# Patient Record
Sex: Male | Born: 1950 | Race: White | Hispanic: No | Marital: Married | Smoking: Never smoker
Health system: Southern US, Community
[De-identification: ages and names within clinical notes are randomized; demographics above are authoritative.]

## PROBLEM LIST (undated history)

## (undated) DIAGNOSIS — I2510 Atherosclerotic heart disease of native coronary artery without angina pectoris: Secondary | ICD-10-CM

## (undated) DIAGNOSIS — C801 Malignant (primary) neoplasm, unspecified: Secondary | ICD-10-CM

## (undated) DIAGNOSIS — G4730 Sleep apnea, unspecified: Secondary | ICD-10-CM

## (undated) DIAGNOSIS — I209 Angina pectoris, unspecified: Secondary | ICD-10-CM

## (undated) HISTORY — PX: VASECTOMY: SHX75

## (undated) HISTORY — PX: TONSILLECTOMY: SUR1361

## (undated) HISTORY — PX: HERNIA REPAIR: SHX51

---

## 2012-05-04 ENCOUNTER — Emergency Department (HOSPITAL_COMMUNITY)
Admission: EM | Admit: 2012-05-04 | Discharge: 2012-05-04 | Disposition: A | Discharge status: Home / Self Care | Payer: 59 | Attending: Emergency Medicine | Admitting: Emergency Medicine

## 2012-05-04 ENCOUNTER — Emergency Department (HOSPITAL_COMMUNITY): Payer: 59

## 2012-05-04 ENCOUNTER — Encounter (HOSPITAL_COMMUNITY): Payer: Self-pay | Admitting: *Deleted

## 2012-05-04 DIAGNOSIS — Z7982 Long term (current) use of aspirin: Secondary | ICD-10-CM | POA: Insufficient documentation

## 2012-05-04 DIAGNOSIS — R55 Syncope and collapse: Secondary | ICD-10-CM | POA: Insufficient documentation

## 2012-05-04 LAB — COMPREHENSIVE METABOLIC PANEL
ALT: 19 U/L (ref 0–53)
Alkaline Phosphatase: 106 U/L (ref 39–117)
BUN: 14 mg/dL (ref 6–23)
CO2: 28 mEq/L (ref 19–32)
Calcium: 9.4 mg/dL (ref 8.4–10.5)
GFR calc Af Amer: 88 mL/min — ABNORMAL LOW (ref >90)
GFR calc non Af Amer: 76 mL/min — ABNORMAL LOW (ref >90)
Glucose, Bld: 83 mg/dL (ref 70–99)
Potassium: 4.1 mEq/L (ref 3.5–5.1)
Sodium: 135 mEq/L (ref 135–145)
Total Protein: 7.5 g/dL (ref 6.0–8.3)

## 2012-05-04 LAB — CBC
HCT: 47.1 % (ref 39.0–52.0)
Hemoglobin: 16.4 g/dL (ref 13.0–17.0)
MCH: 31.5 pg (ref 26.0–34.0)
MCV: 90.6 fL (ref 78.0–100.0)
RBC: 5.2 MIL/uL (ref 4.22–5.81)
WBC: 6.2 10*3/uL (ref 4.0–10.5)

## 2012-05-04 LAB — DIFFERENTIAL
Eosinophils Absolute: 0.1 10*3/uL (ref 0.0–0.7)
Eosinophils Relative: 2 % (ref 0–5)
Lymphocytes Relative: 29 % (ref 12–46)
Lymphs Abs: 1.8 10*3/uL (ref 0.7–4.0)
Monocytes Relative: 6 % (ref 3–12)

## 2012-05-04 LAB — BASIC METABOLIC PANEL
BUN: 11 mg/dL (ref 6–23)
CO2: 29 mEq/L (ref 19–32)
Calcium: 9 mg/dL (ref 8.4–10.5)
Chloride: 99 mEq/L (ref 96–112)
Creatinine, Ser: 1.03 mg/dL (ref 0.50–1.35)
Glucose, Bld: 77 mg/dL (ref 70–99)

## 2012-05-04 LAB — URINALYSIS, ROUTINE W REFLEX MICROSCOPIC
Bilirubin Urine: NEGATIVE
Glucose, UA: NEGATIVE mg/dL
Ketones, ur: 15 mg/dL — AB
Protein, ur: NEGATIVE mg/dL
pH: 6.5 (ref 5.0–8.0)

## 2012-05-04 LAB — TROPONIN I: Troponin I: <0.3 ng/mL (ref <0.30)

## 2012-05-04 NOTE — ED Notes (Signed)
Pt reports at approx 1130 having onset of feeling faint and lightheaded. At that time, had bilateral numbness to arms and legs and top of head. Reports symptoms have resolved. Grips are equal, no facial droop, no arm drift, speech is clear.

## 2012-05-04 NOTE — ED Notes (Signed)
Patients cbg 128 informed rn ed white.

## 2012-05-04 NOTE — ED Provider Notes (Signed)
History     CSN: [id number]  Arrival date & time 05/04/12  1232   First MD Initiated Contact with Patient 05/04/12 1646      Chief Complaint  Patient presents with  . Numbness  . Near Syncope    (Consider location/radiation/quality/duration/timing/severity/associated sxs/prior treatment) HPI Comments: Patient is a 61 year old male with no significant past medical history who presents with multiple episodes of lightheadedness and numbness/tingling that occurred today. Patient reports gradual onset of the symptoms with each episode which lasted for about 1 minute at a time. Patient reports feeling lightheaded with each episode but the associated numbness changed from arms to lips to legs. Episodes would resolve spontaneously without intervention. No associated symptoms. No aggravating/alleviating factors.    History reviewed. No pertinent past medical history.  History reviewed. No pertinent past surgical history.  History reviewed. No pertinent family history.  History  Substance Use Topics  . Smoking status: Not on file  . Smokeless tobacco: Not on file  . Alcohol Use: No      Review of Systems  Neurological: Positive for weakness and light-headedness.  All other systems reviewed and are negative.    Allergies  Review of patient's allergies indicates no known allergies.  Home Medications   Current Outpatient Rx  Name  Route  Sig  Dispense  Refill  . ASPIRIN EC 81 MG PO TBEC   Oral   Take 81 mg by mouth daily.           BP 157/89  Pulse 70  Temp 98.1 F (36.7 C) (Oral)  Resp 18  SpO2 95%  Physical Exam  Nursing note and vitals reviewed. Constitutional: He is oriented to person, place, and time. He appears well-developed and well-nourished. No distress.  HENT:  Head: Normocephalic and atraumatic.  Mouth/Throat: Oropharynx is clear and moist. No oropharyngeal exudate.  Eyes: Conjunctivae normal and EOM are normal. Pupils are equal, round, and reactive  to light. No scleral icterus.  Neck: Normal range of motion. Neck supple.  Cardiovascular: Normal rate, regular rhythm and intact distal pulses.  Exam reveals no gallop and no friction rub.   No murmur heard. Pulmonary/Chest: Effort normal and breath sounds normal. He has no wheezes. He has no rales. He exhibits no tenderness.  Abdominal: Soft. He exhibits no distension. There is no tenderness. There is no rebound and no guarding.  Musculoskeletal: Normal range of motion.  Neurological: He is alert and oriented to person, place, and time. No cranial nerve deficit. Coordination normal.       Strength and sensation equal and intact bilaterally. Cerebellar testing done without difficulty. Speech is goal-oriented. Moves limbs without ataxia.   Skin: Skin is warm and dry. He is not diaphoretic.  Psychiatric: He has a normal mood and affect. His behavior is normal.    ED Course  Procedures (including critical care time)  Labs Reviewed  COMPREHENSIVE METABOLIC PANEL - Abnormal; Notable for the following:    GFR calc non Af Amer 76 (*)     GFR calc Af Amer 88 (*)     All other components within normal limits  GLUCOSE, CAPILLARY - Abnormal; Notable for the following:    Glucose-Capillary 128 (*)     All other components within normal limits  URINALYSIS, ROUTINE W REFLEX MICROSCOPIC - Abnormal; Notable for the following:    Ketones, ur 15 (*)     All other components within normal limits  BASIC METABOLIC PANEL - Abnormal; Notable for the following:  GFR calc non Af Amer 76 (*)     GFR calc Af Amer 89 (*)     All other components within normal limits  PROTIME-INR  APTT  CBC  DIFFERENTIAL  TROPONIN I  GLUCOSE, CAPILLARY  POCT I-STAT TROPONIN I   Ct Head Wo Contrast  05/04/2012  *RADIOLOGY REPORT*  Clinical Data: Faint sensation; lightheadedness.  Bilateral numbness involving the arms and legs.  CT HEAD WITHOUT CONTRAST  Technique:  Contiguous axial images were obtained from the base of  the skull through the vertex without contrast.  Comparison: None.  Findings: There is no evidence of acute infarction, mass lesion, or intra- or extra-axial hemorrhage on CT.  Focally increased density of brain structures at the level of the foramen magnum is thought to reflect beam hardening artifact.  Minimal periventricular white matter change may reflect small vessel ischemic microangiopathy.  The posterior fossa, including the cerebellum, brainstem and fourth ventricle, is within normal limits.  The third and lateral ventricles, and basal ganglia are unremarkable in appearance.  The cerebral hemispheres are symmetric in appearance, with normal gray- white differentiation.  No mass effect or midline shift is seen.  There is no evidence of fracture; visualized osseous structures are unremarkable in appearance.  The visualized portions of the orbits are within normal limits.  The paranasal sinuses and mastoid air cells are well-aerated.  No significant soft tissue abnormalities are seen.  IMPRESSION:  1.  No acute intracranial pathology seen on CT. 2.  Minimal small vessel ischemic microangiopathy.   Original Report Authenticated By: Tonia Ghent, M.D.      1. Pre-syncope       MDM  11:19 PM Labs unremarkable. Head CT unremarkable. Patient's symptoms due not warrant any further workup at this time. I will recommend a neurology follow up for the patient. Patient should return with worsening or concerning symptoms.         Emilia Beck, [place] 05/05/12 0116

## 2012-05-04 NOTE — ED Notes (Signed)
Pt states last episode of dizziness with generalized numbness and tingling was at approximately 1500.  Pt states that it seems to come in waves.  Pt denies symptoms at this time.

## 2012-05-23 NOTE — ED Provider Notes (Signed)
Medical screening examination/treatment/procedure(s) were performed by non-physician practitioner and as supervising physician I was immediately available for consultation/collaboration.   Bodey Frizell, MD 05/23/12 0828 

## 2013-12-15 ENCOUNTER — Encounter (HOSPITAL_COMMUNITY): Payer: Self-pay | Admitting: Pharmacy Technician

## 2013-12-16 ENCOUNTER — Other Ambulatory Visit: Payer: Self-pay | Admitting: *Deleted

## 2013-12-16 ENCOUNTER — Inpatient Hospital Stay (HOSPITAL_COMMUNITY)
Admission: RE | Admit: 2013-12-16 | Discharge: 2013-12-25 | DRG: 234 | Disposition: A | Discharge status: Home / Self Care | Payer: 59 | Source: Ambulatory Visit | Attending: Cardiothoracic Surgery | Admitting: Cardiothoracic Surgery

## 2013-12-16 ENCOUNTER — Encounter (HOSPITAL_COMMUNITY)
Admission: RE | Disposition: A | Discharge status: Home / Self Care | Payer: Self-pay | Source: Ambulatory Visit | Attending: Cardiothoracic Surgery

## 2013-12-16 ENCOUNTER — Encounter (HOSPITAL_COMMUNITY): Payer: Self-pay | Admitting: General Practice

## 2013-12-16 DIAGNOSIS — R6510 Systemic inflammatory response syndrome (SIRS) of non-infectious origin without acute organ dysfunction: Secondary | ICD-10-CM | POA: Diagnosis present

## 2013-12-16 DIAGNOSIS — I2584 Coronary atherosclerosis due to calcified coronary lesion: Principal | ICD-10-CM

## 2013-12-16 DIAGNOSIS — I2510 Atherosclerotic heart disease of native coronary artery without angina pectoris: Secondary | ICD-10-CM

## 2013-12-16 DIAGNOSIS — D72829 Elevated white blood cell count, unspecified: Secondary | ICD-10-CM | POA: Diagnosis not present

## 2013-12-16 DIAGNOSIS — Z8249 Family history of ischemic heart disease and other diseases of the circulatory system: Secondary | ICD-10-CM

## 2013-12-16 DIAGNOSIS — I4891 Unspecified atrial fibrillation: Secondary | ICD-10-CM | POA: Diagnosis not present

## 2013-12-16 DIAGNOSIS — J9819 Other pulmonary collapse: Secondary | ICD-10-CM | POA: Diagnosis present

## 2013-12-16 DIAGNOSIS — I200 Unstable angina: Secondary | ICD-10-CM | POA: Diagnosis present

## 2013-12-16 DIAGNOSIS — Z951 Presence of aortocoronary bypass graft: Secondary | ICD-10-CM

## 2013-12-16 DIAGNOSIS — E785 Hyperlipidemia, unspecified: Secondary | ICD-10-CM | POA: Diagnosis present

## 2013-12-16 DIAGNOSIS — D62 Acute posthemorrhagic anemia: Secondary | ICD-10-CM | POA: Diagnosis not present

## 2013-12-16 DIAGNOSIS — C61 Malignant neoplasm of prostate: Secondary | ICD-10-CM | POA: Diagnosis present

## 2013-12-16 DIAGNOSIS — I10 Essential (primary) hypertension: Secondary | ICD-10-CM | POA: Diagnosis present

## 2013-12-16 HISTORY — DX: Sleep apnea, unspecified: G47.30

## 2013-12-16 HISTORY — PX: LEFT HEART CATHETERIZATION WITH CORONARY ANGIOGRAM: SHX5451

## 2013-12-16 HISTORY — DX: Atherosclerotic heart disease of native coronary artery without angina pectoris: I25.10

## 2013-12-16 HISTORY — DX: Malignant (primary) neoplasm, unspecified: C80.1

## 2013-12-16 HISTORY — DX: Angina pectoris, unspecified: I20.9

## 2013-12-16 LAB — CBC
HEMATOCRIT: 47.8 % (ref 39.0–52.0)
Hemoglobin: 16.5 g/dL (ref 13.0–17.0)
MCH: 31.9 pg (ref 26.0–34.0)
MCHC: 34.5 g/dL (ref 30.0–36.0)
MCV: 92.5 fL (ref 78.0–100.0)
Platelets: 169 10*3/uL (ref 150–400)
RBC: 5.17 MIL/uL (ref 4.22–5.81)
RDW: 13 % (ref 11.5–15.5)
WBC: 5.7 10*3/uL (ref 4.0–10.5)

## 2013-12-16 LAB — CREATININE, SERUM: Creatinine, Ser: 0.84 mg/dL (ref 0.50–1.35)

## 2013-12-16 SURGERY — LEFT HEART CATHETERIZATION WITH CORONARY ANGIOGRAM
Anesthesia: LOCAL

## 2013-12-16 MED ORDER — HEPARIN (PORCINE) IN NACL 2-0.9 UNIT/ML-% IJ SOLN
INTRAMUSCULAR | Status: AC
  Filled 2013-12-16: qty 1000

## 2013-12-16 MED ORDER — VERAPAMIL HCL 2.5 MG/ML IV SOLN
INTRAVENOUS | Status: AC
Start: 2013-12-16 — End: 2013-12-16
  Filled 2013-12-16: qty 2

## 2013-12-16 MED ORDER — METOPROLOL TARTRATE 25 MG PO TABS
25.0000 mg | ORAL_TABLET | Freq: Two times a day (BID) | ORAL | Status: DC
  Administered 2013-12-16 – 2013-12-18 (×5): 25 mg via ORAL
  Filled 2013-12-16 (×9): qty 1

## 2013-12-16 MED ORDER — LIDOCAINE HCL (PF) 1 % IJ SOLN
INTRAMUSCULAR | Status: AC
  Filled 2013-12-16: qty 30

## 2013-12-16 MED ORDER — ENOXAPARIN SODIUM 40 MG/0.4ML ~~LOC~~ SOLN
40.0000 mg | SUBCUTANEOUS | Status: DC
Start: 2013-12-17 — End: 2013-12-19
  Administered 2013-12-17 – 2013-12-18 (×2): 40 mg via SUBCUTANEOUS
  Filled 2013-12-16 (×4): qty 0.4

## 2013-12-16 MED ORDER — HEPARIN SODIUM (PORCINE) 1000 UNIT/ML IJ SOLN
INTRAMUSCULAR | Status: AC
  Filled 2013-12-16: qty 1

## 2013-12-16 MED ORDER — SODIUM CHLORIDE 0.9 % IV SOLN: INTRAVENOUS | Status: DC

## 2013-12-16 MED ORDER — SODIUM CHLORIDE 0.9 % IV SOLN: 250.0000 mL | INTRAVENOUS | Status: DC | PRN

## 2013-12-16 MED ORDER — AMLODIPINE BESYLATE 2.5 MG PO TABS
2.5000 mg | ORAL_TABLET | Freq: Every day | ORAL | Status: DC
  Administered 2013-12-17 – 2013-12-18 (×2): 2.5 mg via ORAL
  Filled 2013-12-16 (×4): qty 1

## 2013-12-16 MED ORDER — ATORVASTATIN CALCIUM 80 MG PO TABS
80.0000 mg | ORAL_TABLET | Freq: Every day | ORAL | Status: DC
  Administered 2013-12-17 – 2013-12-18 (×2): 80 mg via ORAL
  Filled 2013-12-16 (×3): qty 1

## 2013-12-16 MED ORDER — ONDANSETRON HCL 4 MG/2ML IJ SOLN: 4.0000 mg | Freq: Four times a day (QID) | INTRAMUSCULAR | Status: DC | PRN

## 2013-12-16 MED ORDER — SODIUM CHLORIDE 0.9 % IJ SOLN: 3.0000 mL | Freq: Two times a day (BID) | INTRAMUSCULAR | Status: DC

## 2013-12-16 MED ORDER — ASPIRIN 81 MG PO CHEW
CHEWABLE_TABLET | ORAL | Status: AC
  Filled 2013-12-16: qty 1

## 2013-12-16 MED ORDER — SODIUM CHLORIDE 0.9 % IJ SOLN
3.0000 mL | Freq: Two times a day (BID) | INTRAMUSCULAR | Status: DC
  Administered 2013-12-17 – 2013-12-18 (×2): 3 mL via INTRAVENOUS

## 2013-12-16 MED ORDER — ASPIRIN 81 MG PO CHEW
81.0000 mg | CHEWABLE_TABLET | ORAL | Status: AC
  Administered 2013-12-16: 81 mg via ORAL

## 2013-12-16 MED ORDER — MIDAZOLAM HCL 2 MG/2ML IJ SOLN
INTRAMUSCULAR | Status: AC
  Filled 2013-12-16: qty 2

## 2013-12-16 MED ORDER — ALPRAZOLAM 0.25 MG PO TABS: 0.2500 mg | ORAL_TABLET | Freq: Two times a day (BID) | ORAL | Status: DC | PRN

## 2013-12-16 MED ORDER — SODIUM CHLORIDE 0.9 % IV SOLN
250.0000 mL | INTRAVENOUS | Status: DC | PRN
  Administered 2013-12-16: 250 mL via INTRAVENOUS

## 2013-12-16 MED ORDER — ASPIRIN EC 81 MG PO TBEC
81.0000 mg | DELAYED_RELEASE_TABLET | Freq: Every day | ORAL | Status: DC
  Administered 2013-12-17 – 2013-12-20 (×3): 81 mg via ORAL
  Filled 2013-12-16 (×4): qty 1

## 2013-12-16 MED ORDER — HYDROMORPHONE HCL PF 1 MG/ML IJ SOLN
INTRAMUSCULAR | Status: AC
  Filled 2013-12-16: qty 1

## 2013-12-16 MED ORDER — SODIUM CHLORIDE 0.9 % IJ SOLN
3.0000 mL | INTRAMUSCULAR | Status: DC | PRN
  Administered 2013-12-16: 3 mL via INTRAVENOUS

## 2013-12-16 MED ORDER — ACETAMINOPHEN 325 MG PO TABS: 650.0000 mg | ORAL_TABLET | ORAL | Status: DC | PRN

## 2013-12-16 MED ORDER — ZOLPIDEM TARTRATE 5 MG PO TABS
5.0000 mg | ORAL_TABLET | Freq: Every evening | ORAL | Status: DC | PRN
Start: 2013-12-16 — End: 2013-12-19
  Administered 2013-12-18: 5 mg via ORAL
  Filled 2013-12-16: qty 1

## 2013-12-16 MED ORDER — SODIUM CHLORIDE 0.9 % IJ SOLN: 3.0000 mL | INTRAMUSCULAR | Status: DC | PRN

## 2013-12-16 MED ORDER — SODIUM CHLORIDE 0.9 % IV SOLN: 1.0000 mL/kg/h | INTRAVENOUS | Status: AC

## 2013-12-16 NOTE — H&P (Signed)
  Please see office visit notes for complete details of HPI.  

## 2013-12-16 NOTE — CV Procedure (Signed)
Procedure performed:  Left heart catheterization including hemodynamic monitoring of the left ventricle, LV gram, selective right and left coronary arteriography. Right and left IMA angiogram.   Indication patient is a 63 year-old Caucasian male with history of hypertension,  hyperlipidemia, who presents with class III angina pectoris. Patient has  had non invasive testing which was markedly abnormal revealing inferior wall scar with significant amount of peri-infarct ischemia and also lateral wall ischemia with ejection fraction of 45%. He had rapid progression of symptoms of angina pectoris. Hence is brought to the cardiac catheterization lab to evaluate the  coronary anatomy for definitive diagnosis of CAD.  Hemodynamic data:  Left ventricular pressure was 115/8 with LVEDP of 20 mm mercury. Aortic pressure was 109/72 with a mean of 90 mm mercury. There was no pressure gradient across the aortic valve  Left ventricle: Performed in the RAO projection revealed LVEF of 50-55% with very mild mid to distal inferior hypokinesis. There was no significant MR.   Right coronary artery: Dominant. The right coronary is diffusely diseased, proximal segment has a 40-50% stenosis followed by distal segment which is occluded chronically. Distal RCA supplied by collaterals from the left.  Left main coronary artery is large mildly calcified with a diffuse 20% stenosis.  Circumflex coronary artery: A large vessel giving origin to a large obtuse marginal 1.  The proximal circumflex shows a 20-30% stenosis, mild calcified, midsegment of the circumflex coronary artery is chronically occluded with bridging collaterals. Just after the reconstitution, there is a 82 branch which is the severe diffuse disease in the proximal segment, but a moderate size vessel distally measuring at least about 2.5 mm. From the proximal segment there is a large OM1 with a high-grade 90% stenosis. The midsegment to distal segment of the  circumflex coronary artery shows a long segment high-grade 80-85% stenosis. Distal circumflex at the bifurcation has high-grade 90% stenosis.  LAD:  LAD shows mild to moderate amount of proximal calcification. In the proximal segment of the LAD, there is a 50% stenosis followed by a tandem 99% stenosis. 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 There is a large diagonal-1 and distal to the origin of the diagonal the LAD has a 60-70% stenosis.   LIMA and RIMA: The left and right subclavian artery and RIMA and LIMA are widely patent. Both LIMA and RIMA are tortuous in the proximal segment.  Impression: Severe triple vessel coronary artery disease involving high-grade proximal LAD which is subtotally occluded, mid circumflex CTO with bridging collaterals followed by mid to distal high-grade 80-85% stenosis. Towards the termination of the circumflex where it bifurcates, there is high-grade lesion about 90%. AV groove circumflex has high-grade 90% proximal long segment stenosis. Right coronary artery is occluded with contralateral collaterals from left-to-right.  Due to severe triple vessel coronary artery disease, symptoms rapidly progressing over the last few weeks, there is high risk for cardiac events including sudden cardiac death. Given this anatomy, I do not feel comfortable for patient to be discharged home, patient will be admitted for consideration for inpatient CABG.  Technique: Under sterile precautions using a 6 French right radial  arterial access, a 6 French sheath was introduced into the right radial artery. A 5 [place] Tig 4 catheter was advanced into the ascending aorta selective  right coronary artery and left coronary artery was cannulated and angiography was performed in multiple views. The catheter was pulled back Out of the body over exchange length J-wire. Same Catheter was used to perform LV  gram which was performed in RAO projection. I also performed left internal mammary with  ligation with the same catheter and also the right internal mammary arteriogram with the same catheter, catheter pulled out of the body over J-Wire. NO immediate complications noted. Patient tolerated the procedure well. A total of 90 cc of contrast was utilized for diagnostic angiography.

## 2013-12-16 NOTE — Progress Notes (Signed)
Utilization review completed.  

## 2013-12-16 NOTE — Consult Note (Addendum)
[address]       [place] [address]             [phone]                 Akbar Yip [facility] Medical Record #[id number] Date of Birth: [birth year]  Referring: Dr Einar Gip Primary Care: Horatio Pel, MD  Chief Complaint:  Severe CAD  History of Present Illness:    The patient is a 63 year old white male with class III angina and abnormal nuclear medicine study admitted today for cardiac catheterization. The nuclear study showed inferior wall scar and peri-infarct ischemia with decreased EF at 45 %.  He was found t ohave severe 3 vessel disease and we have been asked to consult for consideration of surgical revascularization. His sx have progressed over 6 weeks. Pain is primarily mid sternal and always exertional. He has mild SOB with exertion.    Current Activity/ Functional Status: Patient is independent with mobility/ambulation, transfers, ADL's, IADL's.   Zubrod Score: At the time of surgery this patient's most appropriate activity status/level should be described as: []     0    Normal activity, no symptoms [x]     1    Restricted in physical strenuous activity but ambulatory, able to do out light work []     2    Ambulatory and capable of self care, unable to do work activities, up and about                 more than 50%  Of the time                            []     3    Only limited self care, in bed greater than 50% of waking hours []     4    Completely disabled, no self care, confined to bed or chair []     5    Moribund  PMH-   1 hypertension 2 hyperlipidemia 3 class III angina 4 sleep apnea- + moderate by study, beingfitted for cpap device 5 prostate cancer with seed implant  PSH 1 vasectomy 2 right inguinal hernia 3 tonsils     History  Smoking status  . Not on file  Smokeless tobacco  . Not on file    History  Alcohol Use No    History   Social History  . Marital Status: Married    Spouse Name: N/A     Number of Children: N/A  . Years of Education: N/A   Occupational History  . Not on file.   Social History Main Topics  . Smoking status: Not on file  . Smokeless tobacco: Not on file  . Alcohol Use: No  . Drug Use: No  . Sexual Activity:    Other Topics Concern  . Not on file   Social History Narrative  . No narrative on file    No Known Allergies  Current Facility-Administered Medications  Medication Dose Route Frequency Provider Last Rate Last Dose  . 0.9 %  sodium chloride infusion  1 mL/kg/hr Intravenous Continuous Laverda Page, MD      . 0.9 %  sodium chloride infusion  250 mL Intravenous PRN Laverda Page, MD      . acetaminophen (TYLENOL) tablet 650 mg  650 mg Oral Q4H PRN Laverda Page, MD      .  ALPRAZolam Duanne Moron) tablet 0.25 mg  0.25 mg Oral BID PRN Laverda Page, MD      . Derrill Memo ON 12/17/2013] amLODipine (NORVASC) tablet 2.5 mg  2.5 mg Oral Daily Laverda Page, MD      . aspirin 81 MG chewable tablet           . [START ON 12/17/2013] aspirin EC tablet 81 mg  81 mg Oral Daily Laverda Page, MD      . Derrill Memo ON 12/17/2013] atorvastatin (LIPITOR) tablet 80 mg  80 mg Oral Daily Laverda Page, MD      . Derrill Memo ON 12/17/2013] enoxaparin (LOVENOX) injection 40 mg  40 mg Subcutaneous Q24H Laverda Page, MD      . metoprolol tartrate (LOPRESSOR) tablet 25 mg  25 mg Oral BID Laverda Page, MD      . ondansetron [facility]) injection 4 mg  4 mg Intravenous Q6H PRN Laverda Page, MD      . sodium chloride 0.9 % injection 3 mL  3 mL Intravenous Q12H Laverda Page, MD      . sodium chloride 0.9 % injection 3 mL  3 mL Intravenous PRN Laverda Page, MD      . zolpidem (AMBIEN) tablet 5 mg  5 mg Oral QHS PRN,MR X 1 Laverda Page, MD        Prescriptions prior to admission  Medication Sig Dispense Refill  . amLODipine (NORVASC) 2.5 MG tablet Take 2.5 mg by mouth daily.      Marland Kitchen aspirin EC 81 MG tablet Take 81 mg by mouth daily.       Marland Kitchen atorvastatin (LIPITOR) 80 MG tablet Take 80 mg by mouth daily.      . metoprolol tartrate (LOPRESSOR) 25 MG tablet Take 25 mg by mouth 2 (two) times daily.        Family history: father deceased from MI at early age   Mother COPD  Review of Systems:     Cardiac Review of Systems: Y or N  Chest Pain [  y  ]  Resting SOB [ n  ] Exertional SOB  [ y ]  34 [ y ]   Pedal Edema [ n  ]    Palpitations [ n ] Syncope  [ n ]   Presyncope [ n  ]  General Review of Systems: [Y] = yes [  ]=no Constitional: recent weight change Blue.Reese  ]; anorexia [n  ]; fatigue [ y ]; nausea [ n ]; night sweats [ n ]; fever [n  ]; or chills [ n ]                                                               Dental: poor dentition[  n]; Last Dentist visit: 1 wek ago  Eye : blurred vision [n  ]; diplopia Florencio.Farrier   ]; vision changes [ n ];  Amaurosis fugax[ n ]; Resp: cough [n  ];  wheezing[ n ];  hemoptysis[ n ]; shortness of breath[ y ]; paroxysmal nocturnal dyspnea[ n ]; dyspnea on exertion[ n ]; or orthopnea[ y ];  GI:  gallstones[ n ], vomiting[ n ];  dysphagia[  n]; melena[n  ];  hematochezia [n  ]; heartburn[ n ];  Hx of  Colonoscopy[y  ];1 year ago GU: kidney stones [ n ]; hematuria[n  ];   dysuria [ n ];  nocturia[ n ];  history of     obstruction [n  ]; urinary frequency [ n ]  + prostate CA with seed implant             Skin: rash, swelling[n  ];, hair loss[  ];  peripheral edema[ n ];  or itching[  ]; Musculosketetal: myalgias[n  ];  joint swelling[ n ];  joint erythema[n  ];  joint pain[n  ];  back pain[n  ];  Heme/Lymph: bruising[n  ];  bleeding[n  ];  anemia[ n ];  Neuro: TIA[n  ];  headaches[ n ];  stroke[n  ];  vertigo[ n ];  seizures[ n ];   paresthesias[n  ];  difficulty walking[n  ];  Psych:depression[ n ]; anxiety[ n ];  Endocrine: diabetes[n  ];  thyroid dysfunction[n  ];  Immunizations: Flu [  ]; Pneumococcal[  ];  Other:  Physical Exam: BP 108/64  Pulse 60  Temp(Src) 98.1 F (36.7 C) (Oral)   Resp 12  Ht 5\' 6"  (1.676 m)  Wt 235 lb (106.595 kg)  BMI 37.95 kg/m2  SpO2 96%  General appearance: alert, cooperative, appears stated age and no distress Neurologic: intact Heart: regular rate and rhythm, S1, S2 normal, no murmur, click, rub or gallop Lungs: clear to auscultation bilaterally Abdomen: soft, non-tender; bowel sounds normal; no masses,  no organomegaly Extremities: extremities normal, atraumatic, no cyanosis or edema pulses- equal and intact no carotid bruits Skin - no rashes or lesions Gy/rectal- deferred  Diagnostic Studies & Laboratory data:   Procedure performed:  Left heart catheterization including hemodynamic monitoring of the left ventricle, LV gram, selective right and left coronary arteriography. Right and left IMA angiogram.  Indication patient is a 63 year-old Caucasian male with history of hypertension, hyperlipidemia, who presents with class III angina pectoris. Patient has had non invasive testing which was markedly abnormal revealing inferior wall scar with significant amount of peri-infarct ischemia and also lateral wall ischemia with ejection fraction of 45%. He had rapid progression of symptoms of angina pectoris. Hence is brought to the cardiac catheterization lab to evaluate the coronary anatomy for definitive diagnosis of CAD.  Hemodynamic data:  Left ventricular pressure was 115/8 with LVEDP of 20 mm mercury. Aortic pressure was 109/72 with a mean of 90 mm mercury. There was no pressure gradient across the aortic valve  Left ventricle: Performed in the RAO projection revealed LVEF of 50-55% with very mild mid to distal inferior hypokinesis. There was no significant MR.  Right coronary artery: Dominant. The right coronary is diffusely diseased, proximal segment has a 40-50% stenosis followed by distal segment which is occluded chronically. Distal RCA supplied by collaterals from the left.  Left main coronary artery is large mildly calcified with a  diffuse 20% stenosis.  Circumflex coronary artery: A large vessel giving origin to a large obtuse marginal 1. The proximal circumflex shows a 20-30% stenosis, mild calcified, midsegment of the circumflex coronary artery is chronically occluded with bridging collaterals. Just after the reconstitution, there is a 82 branch which is the severe diffuse disease in the proximal segment, but a moderate size vessel distally measuring at least about 2.5 mm. From the proximal segment there is a large OM1 with a high-grade 90% stenosis. The midsegment to distal segment of the circumflex coronary artery shows a long segment high-grade 80-85% stenosis. Distal  circumflex at the bifurcation has high-grade 90% stenosis.  LAD: LAD shows mild to moderate amount of proximal calcification. In the proximal segment of the LAD, there is a 50% stenosis followed by a tandem 99% stenosis. 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 There is a large diagonal-1 and distal to the origin of the diagonal the LAD has a 60-70% stenosis.  LIMA and RIMA: The left and right subclavian artery and RIMA and LIMA are widely patent. Both LIMA and RIMA are tortuous in the proximal segment.  Impression: Severe triple vessel coronary artery disease involving high-grade proximal LAD which is subtotally occluded, mid circumflex CTO with bridging collaterals followed by mid to distal high-grade 80-85% stenosis. Towards the termination of the circumflex where it bifurcates, there is high-grade lesion about 90%. AV groove circumflex has high-grade 90% proximal long segment stenosis. Right coronary artery is occluded with contralateral collaterals from left-to-right.  Due to severe triple vessel coronary artery disease, symptoms rapidly progressing over the last few weeks, there is high risk for cardiac events including sudden cardiac death. Given this anatomy, I do not feel comfortable for patient to be discharged home, patient will be admitted for  consideration for inpatient CABG.  Technique: Under sterile precautions using a 6 French right radial arterial access, a 6 French sheath was introduced into the right radial artery. A 5 [place] Tig 4 catheter was advanced into the ascending aorta selective right coronary artery and left coronary artery was cannulated and angiography was performed in multiple views. The catheter was pulled back Out of the body over exchange length J-wire. Same Catheter was used to perform LV gram which was performed in RAO projection. I also performed left internal mammary with ligation with the same catheter and also the right internal mammary arteriogram with the same catheter, catheter pulled out of the body over J-Wire. NO immediate complications noted. Patient tolerated the procedure well. A total of 90 cc of contrast was utilized for diagnostic angiography.        Recent Radiology Findings:   No results found.    Recent Lab Findings: Lab Results  Component Value Date   WBC 5.7 12/16/2013   HGB 16.5 12/16/2013   HCT 47.8 12/16/2013   PLT 169 12/16/2013   GLUCOSE 77 05/04/2012   ALT 19 05/04/2012   AST 19 05/04/2012   NA 136 05/04/2012   K 3.7 05/04/2012   CL 99 05/04/2012   CREATININE 0.84 12/16/2013   BUN 11 05/04/2012   CO2 29 05/04/2012   INR 1.02 05/04/2012      Assessment / Plan:   Severe 3 vessel disease, with progressive angina Plan CABG for severe 3 vessel disease Friday 8/14 Risks and options discussed with                                    Patient  [name] MD      [address] [place] [address] Office [phone]   Beeper [phone]

## 2013-12-16 NOTE — Progress Notes (Signed)
TR band progressively  deflated per protocol then removed from right wrist with no complication. No bleeding, pain or numbness reported . Clear dressing with Tegaderm applied. VS are WNL. Will continue to monitor.  Ferdinand Lango, RN

## 2013-12-16 NOTE — Interval H&P Note (Signed)
History and Physical Interval Note:  12/16/2013 8:47 AM  Robert Decker  has presented today for surgery, with the diagnosis of abnormal nuc  The various methods of treatment have been discussed with the patient and family. After consideration of risks, benefits and other options for treatment, the patient has consented to  Procedure(s): LEFT HEART CATHETERIZATION WITH CORONARY ANGIOGRAM (N/A) and possible PCI as a surgical intervention .  The patient's history has been reviewed, patient examined, no change in status, stable for surgery.  I have reviewed the patient's chart and labs.  Questions were answered to the patient's satisfaction.     Laverda Page

## 2013-12-17 ENCOUNTER — Other Ambulatory Visit: Payer: Self-pay

## 2013-12-17 ENCOUNTER — Inpatient Hospital Stay (HOSPITAL_COMMUNITY): Payer: 59

## 2013-12-17 ENCOUNTER — Other Ambulatory Visit: Payer: Self-pay | Admitting: *Deleted

## 2013-12-17 ENCOUNTER — Telehealth (HOSPITAL_COMMUNITY): Payer: Self-pay | Admitting: Unknown Physician Specialty

## 2013-12-17 ENCOUNTER — Ambulatory Visit (HOSPITAL_COMMUNITY): Payer: 59

## 2013-12-17 DIAGNOSIS — I2510 Atherosclerotic heart disease of native coronary artery without angina pectoris: Secondary | ICD-10-CM

## 2013-12-17 DIAGNOSIS — I2584 Coronary atherosclerosis due to calcified coronary lesion: Secondary | ICD-10-CM

## 2013-12-17 LAB — COMPREHENSIVE METABOLIC PANEL
ALT: 31 U/L (ref 0–53)
AST: 25 U/L (ref 0–37)
Albumin: 3.9 g/dL (ref 3.5–5.2)
Alkaline Phosphatase: 100 U/L (ref 39–117)
Anion gap: 12 (ref 5–15)
BUN: 13 mg/dL (ref 6–23)
CO2: 28 mEq/L (ref 19–32)
Calcium: 9.3 mg/dL (ref 8.4–10.5)
Chloride: 103 mEq/L (ref 96–112)
Creatinine, Ser: 0.87 mg/dL (ref 0.50–1.35)
GFR calc Af Amer: >90 mL/min (ref >90)
GFR calc non Af Amer: >90 mL/min (ref >90)
Glucose, Bld: 88 mg/dL (ref 70–99)
Potassium: 4.5 mEq/L (ref 3.7–5.3)
Sodium: 143 mEq/L (ref 137–147)
Total Bilirubin: 1.8 mg/dL — ABNORMAL HIGH (ref 0.3–1.2)
Total Protein: 7.4 g/dL (ref 6.0–8.3)

## 2013-12-17 LAB — CBC
HCT: 52.3 % — ABNORMAL HIGH (ref 39.0–52.0)
Hemoglobin: 18 g/dL — ABNORMAL HIGH (ref 13.0–17.0)
MCH: 32.2 pg (ref 26.0–34.0)
MCHC: 34.4 g/dL (ref 30.0–36.0)
MCV: 93.6 fL (ref 78.0–100.0)
Platelets: 184 10*3/uL (ref 150–400)
RBC: 5.59 MIL/uL (ref 4.22–5.81)
RDW: 13 % (ref 11.5–15.5)
WBC: 7.3 10*3/uL (ref 4.0–10.5)

## 2013-12-17 LAB — URINALYSIS, ROUTINE W REFLEX MICROSCOPIC
Bilirubin Urine: NEGATIVE
Glucose, UA: NEGATIVE mg/dL
Hgb urine dipstick: NEGATIVE
Ketones, ur: NEGATIVE mg/dL
Leukocytes, UA: NEGATIVE
Nitrite: NEGATIVE
Protein, ur: NEGATIVE mg/dL
Specific Gravity, Urine: 1.007 (ref 1.005–1.030)
Urobilinogen, UA: 0.2 mg/dL (ref 0.0–1.0)
pH: 6 (ref 5.0–8.0)

## 2013-12-17 LAB — APTT: aPTT: 31 seconds (ref 24–37)

## 2013-12-17 LAB — PROTIME-INR
INR: 1.07 (ref 0.00–1.49)
Prothrombin Time: 13.9 seconds (ref 11.6–15.2)

## 2013-12-17 LAB — TYPE AND SCREEN
ABO/RH(D): O POS
Antibody Screen: NEGATIVE

## 2013-12-17 LAB — ABO/RH: ABO/RH(D): O POS

## 2013-12-17 LAB — HEMOGLOBIN A1C
Hgb A1c MFr Bld: 5.4 % (ref <5.7)
Mean Plasma Glucose: 108 mg/dL (ref <117)

## 2013-12-17 NOTE — Progress Notes (Signed)
Subjective:  Well, no chest pain at rest.  Objective:  Vital Signs in the last 24 hours: Temp:  [98.1 F (36.7 C)-98.5 F (36.9 C)] 98.4 F (36.9 C) (08/12 1434) Pulse Rate:  [64-75] 64 (08/12 1434) Resp:  [14-18] 18 (08/12 1434) BP: (115-134)/(68-74) 125/74 mmHg (08/12 1434) SpO2:  [94 %-99 %] 99 % (08/12 1434) Weight:  [106.387 kg (234 lb 8.7 oz)] 106.387 kg (234 lb 8.7 oz) (08/12 0657)  Intake/Output from previous day: 08/11 0701 - 08/12 0700 In: 240 [P.O.:240] Out: 2050 [Urine:2050]  Physical Exam:   General appearance: alert, cooperative, appears stated age, no distress and mildly obese Eyes: negative findings: lids and lashes normal Neck: no adenopathy, no carotid bruit and no JVD Neck: JVP - normal, carotids 2+= without bruits Resp: clear to auscultation bilaterally Chest wall: no tenderness Cardio: regular rate and rhythm, S1, S2 normal, no murmur, click, rub or gallop GI: soft, non-tender; bowel sounds normal; no masses,  no organomegaly Extremities: extremities normal, atraumatic, no cyanosis or edema    Lab Results: BMP  Recent Labs  12/16/13 1320 12/17/13 1811  NA  --  143  K  --  4.5  CL  --  103  CO2  --  28  GLUCOSE  --  88  BUN  --  13  CREATININE 0.84 0.87  CALCIUM  --  9.3  GFRNONAA >90 >90  GFRAA >90 >90    CBC  Recent Labs Lab 12/17/13 1811  WBC 7.3  RBC 5.59  HGB 18.0*  HCT 52.3*  PLT 184  MCV 93.6  MCH 32.2  MCHC 34.4  RDW 13.0    HEMOGLOBIN A1C No results found for this basename: HGBA1C, MPG    Cardiac Panel (last 3 results) No results found for this basename: CKTOTAL, CKMB, TROPONINI, RELINDX,  in the last 8760 hours  BNP (last 3 results) No results found for this basename: PROBNP,  in the last 8760 hours  TSH No results found for this basename: TSH,  in the last 8760 hours  CHOLESTEROL No results found for this basename: CHOL,  in the last 8760 hours  Hepatic Function Panel  Recent Labs  12/17/13 1811   PROT 7.4  ALBUMIN 3.9  AST PENDING  ALT 31  ALKPHOS 100  BILITOT 1.8*    Imaging: Imaging results have been reviewed  Cardiac Studies:  EKG: Atrial 03/2014: Normal sinus rhythm at a rate of 67 beats a minute, normal intervals, 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 Coronary angiogram 12/16/2013: Severe triple vessel coronary artery disease involving high-grade proximal LAD which is subtotally occluded, mid circumflex CTO with bridging collaterals followed by mid to distal high-grade 80-85% stenosis. Towards the termination of the circumflex where it bifurcates, there is high-grade lesion about 90%. AV groove circumflex has high-grade 90% proximal long segment stenosis. Right coronary artery is occluded with contralateral collaterals from left-to-right.   Assessment/Plan:  1. 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 Needs inpatient CABG. No chest pain at rest. 2. Hypertension 3. Hyperlipidemia Recommendation: We will continue to watch him over the next 2 days and CABG has been scheduled for this Friday. Patient and his wife are aware of the high risk coronary anatomy and the need for hospitalization. I've discussed the findings with Dr. Ceasar Mons who has seen the patient and agrees with the plan to proceed with CABG.  Laverda Page, M.D. 12/17/2013, 7:23 PM [facility]  Pager: [phone] Office: [phone] If no answer: [phone]

## 2013-12-17 NOTE — Progress Notes (Signed)
CARDIAC REHAB PHASE I   PRE:  Rate/Rhythm: 76 SR    BP: sitting 132/90    SaO2: 96 RA  MODE:  Ambulation: 460 ft   POST:  Rate/Rhythm: 89 SR    BP: sitting 140/90     SaO2: 97 RA  Tolerated well. Denied angina. Pre-op ed completed including mobility, sternal precautions, IS. Pt very agreeable with positive outlook. Gave brochure for CRPII post-d/c. Gave OHS booklet and guideline and also video instructions. Pt can walk lightly independently. [phone]   Josephina Shih Cowarts CES, ACSM 12/17/2013 10:25 AM

## 2013-12-17 NOTE — Progress Notes (Addendum)
      [address]       [place], [address]             [phone]                 1 Day Post-Op Procedure(s) (LRB): LEFT HEART CATHETERIZATION WITH CORONARY ANGIOGRAM (N/A)  LOS: 1 day   Subjective: No chest pain  Objective: Vital signs in last 24 hours: Patient Vitals for the past 24 hrs:  BP Temp Temp src Pulse Resp SpO2 Weight  12/17/13 1434 125/74 mmHg 98.4 F (36.9 C) - 64 18 99 % -  12/17/13 0937 134/71 mmHg - - 75 - - -  12/17/13 0657 115/72 mmHg 98.1 F (36.7 C) Oral 67 18 96 % 234 lb 8.7 oz (106.387 kg)  12/16/13 2014 128/68 mmHg 98.5 F (36.9 C) Oral 70 14 94 % -    Filed Weights   12/16/13 0645 12/17/13 0657  Weight: 235 lb (106.595 kg) 234 lb 8.7 oz (106.387 kg)    Hemodynamic parameters for last 24 hours:    Intake/Output from previous day: 08/11 0701 - 08/12 0700 In: 240 [P.O.:240] Out: 2050 [Urine:2050] Intake/Output this shift:    Scheduled Meds: . amLODipine  2.5 mg Oral Daily  . aspirin EC  81 mg Oral Daily  . atorvastatin  80 mg Oral Daily  . enoxaparin (LOVENOX) injection  40 mg Subcutaneous Q24H  . metoprolol tartrate  25 mg Oral BID  . sodium chloride  3 mL Intravenous Q12H   Continuous Infusions:  PRN Meds:.sodium chloride, acetaminophen, ALPRAZolam, ondansetron (ZOFRAN) IV, sodium chloride, zolpidem  General appearance: alert and cooperative Neurologic: intact Heart: regular rate and rhythm, S1, S2 normal, no murmur, click, rub or gallop Lungs: clear to auscultation bilaterally Abdomen: soft, non-tender; bowel sounds normal; no masses,  no organomegaly Extremities: extremities normal, atraumatic, no cyanosis or edema and Homans sign is negative, no sign of DVT  Lab Results: CBC: Recent Labs  12/16/13 1320 12/17/13 1811  WBC 5.7 7.3  HGB 16.5 18.0*  HCT 47.8 52.3*  PLT 169 184   BMET:  Recent Labs  12/16/13 1320  CREATININE 0.84    PT/INR: No results found for this basename: LABPROT, INR,  in the  last 72 hours   Radiology No results found.   Assessment/Plan: S/P Procedure(s) (LRB): LEFT HEART CATHETERIZATION WITH CORONARY ANGIOGRAM (N/A) CABG friday The goals risks and alternatives of the planned surgical procedure CABG  have been discussed with the patient in detail. The risks of the procedure including death, infection, stroke, myocardial infarction, bleeding, blood transfusion have all been discussed specifically.  I have quoted Robert Decker a 2 % of perioperative mortality and a complication rate as high as 20%. The patient's questions have been answered.Robert Decker is willing  to proceed with the planned procedure.  Robert Isaac MD 12/17/2013 7:02 PM

## 2013-12-18 ENCOUNTER — Inpatient Hospital Stay (HOSPITAL_COMMUNITY): Payer: 59

## 2013-12-18 DIAGNOSIS — Z01810 Encounter for preprocedural cardiovascular examination: Secondary | ICD-10-CM

## 2013-12-18 LAB — PULMONARY FUNCTION TEST
DL/VA % pred: 148 %
DL/VA: 6.23 ml/min/mmHg/L
DLCO cor % pred: 156 %
DLCO cor: 37.87 ml/min/mmHg
DLCO unc % pred: 169 %
DLCO unc: 41.07 ml/min/mmHg
FEF 25-75 Post: 2.41 L/sec
FEF 25-75 Pre: 1.71 L/sec
FEF2575-%Change-Post: 41 %
FEF2575-%Pred-Post: 105 %
FEF2575-%Pred-Pre: 74 %
FEV1-%Change-Post: 11 %
FEV1-%Pred-Post: 96 %
FEV1-%Pred-Pre: 86 %
FEV1-Post: 2.7 L
FEV1-Pre: 2.42 L
FEV1FVC-%Change-Post: 9 %
FEV1FVC-%Pred-Pre: 93 %
FEV6-%Change-Post: 3 %
FEV6-%Pred-Post: 99 %
FEV6-%Pred-Pre: 96 %
FEV6-Post: 3.49 L
FEV6-Pre: 3.38 L
FEV6FVC-%Change-Post: 1 %
FEV6FVC-%Pred-Post: 104 %
FEV6FVC-%Pred-Pre: 103 %
FVC-%Change-Post: 1 %
FVC-%Pred-Post: 94 %
FVC-%Pred-Pre: 92 %
FVC-Post: 3.5 L
FVC-Pre: 3.44 L
Post FEV1/FVC ratio: 77 %
Post FEV6/FVC ratio: 100 %
Pre FEV1/FVC ratio: 70 %
Pre FEV6/FVC Ratio: 98 %
RV % pred: 78 %
RV: 1.54 L
TLC % pred: 95 %
TLC: 5.54 L

## 2013-12-18 LAB — BLOOD GAS, ARTERIAL
Acid-Base Excess: 1.4 mmol/L (ref 0.0–2.0)
Bicarbonate: 25.5 mEq/L — ABNORMAL HIGH (ref 20.0–24.0)
Drawn by: 36496
FIO2: 0.21 %
O2 Saturation: 96.1 %
Patient temperature: 98.6
TCO2: 26.7 mmol/L (ref 0–100)
pCO2 arterial: 40.7 mmHg (ref 35.0–45.0)
pH, Arterial: 7.413 (ref 7.350–7.450)
pO2, Arterial: 78.3 mmHg — ABNORMAL LOW (ref 80.0–100.0)

## 2013-12-18 LAB — SURGICAL PCR SCREEN
MRSA, PCR: NEGATIVE
Staphylococcus aureus: POSITIVE — AB

## 2013-12-18 MED ORDER — PHENYLEPHRINE HCL 10 MG/ML IJ SOLN
30.0000 ug/min | INTRAVENOUS | Status: AC
  Administered 2013-12-19: 20 ug/min via INTRAVENOUS
  Filled 2013-12-18: qty 2

## 2013-12-18 MED ORDER — ALBUTEROL SULFATE (2.5 MG/3ML) 0.083% IN NEBU
2.5000 mg | INHALATION_SOLUTION | Freq: Once | RESPIRATORY_TRACT | Status: AC
  Administered 2013-12-18: 2.5 mg via RESPIRATORY_TRACT

## 2013-12-18 MED ORDER — INSULIN REGULAR HUMAN 100 UNIT/ML IJ SOLN
INTRAMUSCULAR | Status: AC
  Administered 2013-12-19: 1.2 [IU]/h via INTRAVENOUS
  Filled 2013-12-18: qty 1

## 2013-12-18 MED ORDER — SODIUM CHLORIDE 0.9 % IV SOLN
INTRAVENOUS | Status: DC
  Filled 2013-12-18: qty 30

## 2013-12-18 MED ORDER — CHLORHEXIDINE GLUCONATE CLOTH 2 % EX PADS: 6.0000 | MEDICATED_PAD | Freq: Once | CUTANEOUS | Status: DC

## 2013-12-18 MED ORDER — AMINOCAPROIC ACID 250 MG/ML IV SOLN
INTRAVENOUS | Status: AC
  Administered 2013-12-19: 70 mL/h via INTRAVENOUS
  Filled 2013-12-18: qty 40

## 2013-12-18 MED ORDER — DEXMEDETOMIDINE HCL IN NACL 400 MCG/100ML IV SOLN
0.1000 ug/kg/h | INTRAVENOUS | Status: AC
  Administered 2013-12-19: 0.2 ug/kg/h via INTRAVENOUS
  Filled 2013-12-18: qty 100

## 2013-12-18 MED ORDER — VANCOMYCIN HCL 10 G IV SOLR
1500.0000 mg | INTRAVENOUS | Status: AC
  Administered 2013-12-19: 1500 mg via INTRAVENOUS
  Filled 2013-12-18: qty 1500

## 2013-12-18 MED ORDER — TEMAZEPAM 15 MG PO CAPS: 15.0000 mg | ORAL_CAPSULE | Freq: Once | ORAL | Status: AC | PRN

## 2013-12-18 MED ORDER — PLASMA-LYTE 148 IV SOLN
INTRAVENOUS | Status: AC
  Administered 2013-12-19: 08:00:00
  Filled 2013-12-18: qty 2.5

## 2013-12-18 MED ORDER — METOPROLOL TARTRATE 12.5 MG HALF TABLET
12.5000 mg | ORAL_TABLET | Freq: Once | ORAL | Status: AC
  Administered 2013-12-19: 12.5 mg via ORAL
  Filled 2013-12-18: qty 1

## 2013-12-18 MED ORDER — DOPAMINE-DEXTROSE 3.2-5 MG/ML-% IV SOLN
2.0000 ug/kg/min | INTRAVENOUS | Status: AC
  Administered 2013-12-19: 3 ug/kg/min via INTRAVENOUS
  Filled 2013-12-18: qty 250

## 2013-12-18 MED ORDER — BISACODYL 5 MG PO TBEC
5.0000 mg | DELAYED_RELEASE_TABLET | Freq: Once | ORAL | Status: DC
  Filled 2013-12-18: qty 1

## 2013-12-18 MED ORDER — DEXTROSE 5 % IV SOLN
750.0000 mg | INTRAVENOUS | Status: DC
  Filled 2013-12-18: qty 750

## 2013-12-18 MED ORDER — POTASSIUM CHLORIDE 2 MEQ/ML IV SOLN
80.0000 meq | INTRAVENOUS | Status: DC
  Filled 2013-12-18: qty 40

## 2013-12-18 MED ORDER — EPINEPHRINE HCL 1 MG/ML IJ SOLN
0.5000 ug/min | INTRAMUSCULAR | Status: DC
  Filled 2013-12-18: qty 4

## 2013-12-18 MED ORDER — CEFUROXIME SODIUM 1.5 G IJ SOLR
1.5000 g | INTRAMUSCULAR | Status: AC
  Administered 2013-12-19: .75 g via INTRAVENOUS
  Administered 2013-12-19: 1.5 g via INTRAVENOUS
  Filled 2013-12-18 (×2): qty 1.5

## 2013-12-18 MED ORDER — CHLORHEXIDINE GLUCONATE CLOTH 2 % EX PADS
6.0000 | MEDICATED_PAD | Freq: Once | CUTANEOUS | Status: AC
  Administered 2013-12-18: 6 via TOPICAL

## 2013-12-18 MED ORDER — ALPRAZOLAM 0.25 MG PO TABS
0.2500 mg | ORAL_TABLET | ORAL | Status: DC | PRN
  Administered 2013-12-20: 0.25 mg via ORAL
  Filled 2013-12-18: qty 1

## 2013-12-18 MED ORDER — NITROGLYCERIN IN D5W 200-5 MCG/ML-% IV SOLN
2.0000 ug/min | INTRAVENOUS | Status: AC
  Administered 2013-12-19: 5 ug/min via INTRAVENOUS
  Filled 2013-12-18: qty 250

## 2013-12-18 MED ORDER — MAGNESIUM SULFATE 50 % IJ SOLN
40.0000 meq | INTRAMUSCULAR | Status: DC
  Filled 2013-12-18: qty 10

## 2013-12-18 NOTE — Progress Notes (Signed)
VASCULAR LAB PRELIMINARY  PRELIMINARY  PRELIMINARY  PRELIMINARY  Pre-op Cardiac Surgery  Carotid Findings:  Bilateral:  1-39% ICA stenosis.  Vertebral artery flow is antegrade.      Upper Extremity Right Left  Brachial Pressures 148 triphasic 138 triphasic  Radial Waveforms triphasic biphasic  Ulnar Waveforms biphasic monophasic  Palmar Arch (Allen's Test) WNL WNl   Findings:  Doppler waveforms remain normal with ulnar and radial compressions bilaterally.    Lower  Extremity Right Left  Dorsalis Pedis    Anterior Tibial    Posterior Tibial    Ankle/Brachial Indices      Findings:  Palpable pedal pulses x 4.   Rye Decoste, RVT 12/18/2013, 10:52 AM

## 2013-12-19 ENCOUNTER — Encounter (HOSPITAL_COMMUNITY): Payer: 59 | Admitting: Certified Registered"

## 2013-12-19 ENCOUNTER — Inpatient Hospital Stay (HOSPITAL_COMMUNITY): Payer: 59 | Admitting: Certified Registered"

## 2013-12-19 ENCOUNTER — Encounter (HOSPITAL_COMMUNITY): Payer: Self-pay | Admitting: Anesthesiology

## 2013-12-19 ENCOUNTER — Inpatient Hospital Stay (HOSPITAL_COMMUNITY): Payer: 59

## 2013-12-19 ENCOUNTER — Encounter (HOSPITAL_COMMUNITY)
Admission: RE | Disposition: A | Discharge status: Home / Self Care | Payer: 59 | Source: Ambulatory Visit | Attending: Cardiothoracic Surgery

## 2013-12-19 DIAGNOSIS — I2510 Atherosclerotic heart disease of native coronary artery without angina pectoris: Secondary | ICD-10-CM

## 2013-12-19 DIAGNOSIS — Z951 Presence of aortocoronary bypass graft: Secondary | ICD-10-CM

## 2013-12-19 HISTORY — PX: CORONARY ARTERY BYPASS GRAFT: SHX141

## 2013-12-19 HISTORY — PX: INTRAOPERATIVE TRANSESOPHAGEAL ECHOCARDIOGRAM: SHX5062

## 2013-12-19 LAB — POCT I-STAT, CHEM 8
BUN: 12 mg/dL (ref 6–23)
BUN: 11 mg/dL (ref 6–23)
BUN: 11 mg/dL (ref 6–23)
BUN: 10 mg/dL (ref 6–23)
BUN: 10 mg/dL (ref 6–23)
BUN: 12 mg/dL (ref 6–23)
CALCIUM ION: 1.19 mmol/L (ref 1.13–1.30)
CHLORIDE: 104 meq/L (ref 96–112)
CHLORIDE: 104 meq/L (ref 96–112)
CHLORIDE: 106 meq/L (ref 96–112)
Calcium, Ion: 1.16 mmol/L (ref 1.13–1.30)
Calcium, Ion: 1 mmol/L — ABNORMAL LOW (ref 1.13–1.30)
Calcium, Ion: 0.99 mmol/L — ABNORMAL LOW (ref 1.13–1.30)
Calcium, Ion: 1 mmol/L — ABNORMAL LOW (ref 1.13–1.30)
Calcium, Ion: 1.09 mmol/L — ABNORMAL LOW (ref 1.13–1.30)
Chloride: 105 mEq/L (ref 96–112)
Chloride: 98 mEq/L (ref 96–112)
Chloride: 103 mEq/L (ref 96–112)
Creatinine, Ser: 0.7 mg/dL (ref 0.50–1.35)
Creatinine, Ser: 0.7 mg/dL (ref 0.50–1.35)
Creatinine, Ser: 0.7 mg/dL (ref 0.50–1.35)
Creatinine, Ser: 0.6 mg/dL (ref 0.50–1.35)
Creatinine, Ser: 0.7 mg/dL (ref 0.50–1.35)
Creatinine, Ser: 0.8 mg/dL (ref 0.50–1.35)
GLUCOSE: 100 mg/dL — AB (ref 70–99)
Glucose, Bld: 83 mg/dL (ref 70–99)
Glucose, Bld: 91 mg/dL (ref 70–99)
Glucose, Bld: 124 mg/dL — ABNORMAL HIGH (ref 70–99)
Glucose, Bld: 138 mg/dL — ABNORMAL HIGH (ref 70–99)
Glucose, Bld: 144 mg/dL — ABNORMAL HIGH (ref 70–99)
HCT: 45 % (ref 39.0–52.0)
HCT: 41 % (ref 39.0–52.0)
HEMATOCRIT: 33 % — AB (ref 39.0–52.0)
HEMATOCRIT: 44 % (ref 39.0–52.0)
HEMATOCRIT: 34 % — AB (ref 39.0–52.0)
HEMATOCRIT: 35 % — AB (ref 39.0–52.0)
HEMOGLOBIN: 11.2 g/dL — AB (ref 13.0–17.0)
Hemoglobin: 15.3 g/dL (ref 13.0–17.0)
Hemoglobin: 15 g/dL (ref 13.0–17.0)
Hemoglobin: 11.6 g/dL — ABNORMAL LOW (ref 13.0–17.0)
Hemoglobin: 11.9 g/dL — ABNORMAL LOW (ref 13.0–17.0)
Hemoglobin: 13.9 g/dL (ref 13.0–17.0)
POTASSIUM: 3.6 meq/L — AB (ref 3.7–5.3)
POTASSIUM: 4.7 meq/L (ref 3.7–5.3)
Potassium: 4.1 mEq/L (ref 3.7–5.3)
Potassium: 4.3 mEq/L (ref 3.7–5.3)
Potassium: 3.8 mEq/L (ref 3.7–5.3)
Potassium: 4.2 mEq/L (ref 3.7–5.3)
SODIUM: 142 meq/L (ref 137–147)
SODIUM: 136 meq/L — AB (ref 137–147)
SODIUM: 138 meq/L (ref 137–147)
SODIUM: 140 meq/L (ref 137–147)
Sodium: 140 mEq/L (ref 137–147)
Sodium: 136 mEq/L — ABNORMAL LOW (ref 137–147)
TCO2: 23 mmol/L (ref 0–100)
TCO2: 24 mmol/L (ref 0–100)
TCO2: 24 mmol/L (ref 0–100)
TCO2: 22 mmol/L (ref 0–100)
TCO2: 22 mmol/L (ref 0–100)
TCO2: 20 mmol/L (ref 0–100)

## 2013-12-19 LAB — POCT I-STAT 3, ART BLOOD GAS (G3+)
Acid-Base Excess: 2 mmol/L (ref 0.0–2.0)
Acid-base deficit: 1 mmol/L (ref 0.0–2.0)
Acid-base deficit: 3 mmol/L — ABNORMAL HIGH (ref 0.0–2.0)
Acid-base deficit: 3 mmol/L — ABNORMAL HIGH (ref 0.0–2.0)
BICARBONATE: 24.1 meq/L — AB (ref 20.0–24.0)
BICARBONATE: 22.4 meq/L (ref 20.0–24.0)
Bicarbonate: 28.6 mEq/L — ABNORMAL HIGH (ref 20.0–24.0)
Bicarbonate: 23.2 mEq/L (ref 20.0–24.0)
O2 Saturation: 100 %
O2 Saturation: 93 %
O2 Saturation: 94 %
O2 Saturation: 95 %
PCO2 ART: 47.1 mmHg — AB (ref 35.0–45.0)
PH ART: 7.361 (ref 7.350–7.450)
PH ART: 7.369 (ref 7.350–7.450)
PH ART: 7.343 — AB (ref 7.350–7.450)
PH ART: 7.302 — AB (ref 7.350–7.450)
PO2 ART: 85 mmHg (ref 80.0–100.0)
Patient temperature: 36.4
Patient temperature: 37.4
TCO2: 30 mmol/L (ref 0–100)
TCO2: 25 mmol/L (ref 0–100)
TCO2: 24 mmol/L (ref 0–100)
TCO2: 25 mmol/L (ref 0–100)
pCO2 arterial: 50.4 mmHg — ABNORMAL HIGH (ref 35.0–45.0)
pCO2 arterial: 41.5 mmHg (ref 35.0–45.0)
pCO2 arterial: 41.1 mmHg (ref 35.0–45.0)
pO2, Arterial: 306 mmHg — ABNORMAL HIGH (ref 80.0–100.0)
pO2, Arterial: 67 mmHg — ABNORMAL LOW (ref 80.0–100.0)
pO2, Arterial: 73 mmHg — ABNORMAL LOW (ref 80.0–100.0)

## 2013-12-19 LAB — CBC
HCT: 50 % (ref 39.0–52.0)
HCT: 42.1 % (ref 39.0–52.0)
HCT: 39.9 % (ref 39.0–52.0)
Hemoglobin: 17.1 g/dL — ABNORMAL HIGH (ref 13.0–17.0)
Hemoglobin: 14.9 g/dL (ref 13.0–17.0)
Hemoglobin: 14.1 g/dL (ref 13.0–17.0)
MCH: 31.7 pg (ref 26.0–34.0)
MCH: 32.4 pg (ref 26.0–34.0)
MCH: 31.6 pg (ref 26.0–34.0)
MCHC: 34.2 g/dL (ref 30.0–36.0)
MCHC: 35.4 g/dL (ref 30.0–36.0)
MCHC: 35.3 g/dL (ref 30.0–36.0)
MCV: 92.8 fL (ref 78.0–100.0)
MCV: 91.5 fL (ref 78.0–100.0)
MCV: 89.5 fL (ref 78.0–100.0)
PLATELETS: 124 10*3/uL — AB (ref 150–400)
Platelets: 170 10*3/uL (ref 150–400)
Platelets: 122 10*3/uL — ABNORMAL LOW (ref 150–400)
RBC: 5.39 MIL/uL (ref 4.22–5.81)
RBC: 4.6 MIL/uL (ref 4.22–5.81)
RBC: 4.46 MIL/uL (ref 4.22–5.81)
RDW: 12.9 % (ref 11.5–15.5)
RDW: 12.6 % (ref 11.5–15.5)
RDW: 12.5 % (ref 11.5–15.5)
WBC: 7.5 10*3/uL (ref 4.0–10.5)
WBC: 16.3 10*3/uL — AB (ref 4.0–10.5)
WBC: 13.5 10*3/uL — ABNORMAL HIGH (ref 4.0–10.5)

## 2013-12-19 LAB — PROTIME-INR
INR: 1.46 (ref 0.00–1.49)
Prothrombin Time: 17.7 seconds — ABNORMAL HIGH (ref 11.6–15.2)

## 2013-12-19 LAB — BASIC METABOLIC PANEL
Anion gap: 11 (ref 5–15)
BUN: 14 mg/dL (ref 6–23)
CO2: 27 mEq/L (ref 19–32)
Calcium: 8.7 mg/dL (ref 8.4–10.5)
Chloride: 105 mEq/L (ref 96–112)
Creatinine, Ser: 0.95 mg/dL (ref 0.50–1.35)
GFR calc Af Amer: >90 mL/min (ref >90)
GFR calc non Af Amer: 87 mL/min — ABNORMAL LOW (ref >90)
Glucose, Bld: 94 mg/dL (ref 70–99)
Potassium: 4.2 mEq/L (ref 3.7–5.3)
Sodium: 143 mEq/L (ref 137–147)

## 2013-12-19 LAB — CREATININE, SERUM
Creatinine, Ser: 0.81 mg/dL (ref 0.50–1.35)
GFR calc Af Amer: >90 mL/min (ref >90)
GFR calc non Af Amer: >90 mL/min (ref >90)

## 2013-12-19 LAB — GLUCOSE, CAPILLARY
GLUCOSE-CAPILLARY: 129 mg/dL — AB (ref 70–99)
Glucose-Capillary: 154 mg/dL — ABNORMAL HIGH (ref 70–99)
Glucose-Capillary: 143 mg/dL — ABNORMAL HIGH (ref 70–99)

## 2013-12-19 LAB — POCT I-STAT GLUCOSE
GLUCOSE: 112 mg/dL — AB (ref 70–99)
OPERATOR ID: 3406

## 2013-12-19 LAB — HEMOGLOBIN AND HEMATOCRIT, BLOOD
HCT: 33.8 % — ABNORMAL LOW (ref 39.0–52.0)
Hemoglobin: 12.1 g/dL — ABNORMAL LOW (ref 13.0–17.0)

## 2013-12-19 LAB — PLATELET COUNT: Platelets: 141 10*3/uL — ABNORMAL LOW (ref 150–400)

## 2013-12-19 LAB — POCT I-STAT 4, (NA,K, GLUC, HGB,HCT)
Glucose, Bld: 133 mg/dL — ABNORMAL HIGH (ref 70–99)
HCT: 41 % (ref 39.0–52.0)
HEMOGLOBIN: 13.9 g/dL (ref 13.0–17.0)
Potassium: 3.5 mEq/L — ABNORMAL LOW (ref 3.7–5.3)
SODIUM: 140 meq/L (ref 137–147)

## 2013-12-19 LAB — APTT: aPTT: 30 seconds (ref 24–37)

## 2013-12-19 LAB — MAGNESIUM: Magnesium: 2.8 mg/dL — ABNORMAL HIGH (ref 1.5–2.5)

## 2013-12-19 SURGERY — CORONARY ARTERY BYPASS GRAFTING (CABG)
Anesthesia: General | Site: Chest

## 2013-12-19 MED ORDER — INSULIN REGULAR BOLUS VIA INFUSION
0.0000 [IU] | Freq: Three times a day (TID) | INTRAVENOUS | Status: DC
Start: 2013-12-19 — End: 2013-12-20
  Filled 2013-12-19: qty 10

## 2013-12-19 MED ORDER — FENTANYL CITRATE 0.05 MG/ML IJ SOLN
INTRAMUSCULAR | Status: AC
  Filled 2013-12-19: qty 5

## 2013-12-19 MED ORDER — HEPARIN SODIUM (PORCINE) 1000 UNIT/ML IJ SOLN
INTRAMUSCULAR | Status: AC
  Filled 2013-12-19: qty 1

## 2013-12-19 MED ORDER — DEXTROSE 5 % IV SOLN
1.5000 g | Freq: Two times a day (BID) | INTRAVENOUS | Status: AC
  Administered 2013-12-19 – 2013-12-21 (×4): 1.5 g via INTRAVENOUS
  Filled 2013-12-19 (×4): qty 1.5

## 2013-12-19 MED ORDER — ONDANSETRON HCL 4 MG/2ML IJ SOLN: 4.0000 mg | Freq: Four times a day (QID) | INTRAMUSCULAR | Status: DC | PRN

## 2013-12-19 MED ORDER — PHENYLEPHRINE HCL 10 MG/ML IJ SOLN
10.0000 mg | INTRAVENOUS | Status: DC | PRN
  Administered 2013-12-19: 25 ug/min via INTRAVENOUS

## 2013-12-19 MED ORDER — DEXMEDETOMIDINE HCL IN NACL 200 MCG/50ML IV SOLN
0.1000 ug/kg/h | INTRAVENOUS | Status: DC
  Administered 2013-12-19: 0.5 ug/kg/h via INTRAVENOUS
  Filled 2013-12-19: qty 50

## 2013-12-19 MED ORDER — METOPROLOL TARTRATE 25 MG/10 ML ORAL SUSPENSION
12.5000 mg | Freq: Two times a day (BID) | ORAL | Status: DC
  Filled 2013-12-19 (×3): qty 5

## 2013-12-19 MED ORDER — MORPHINE SULFATE 2 MG/ML IJ SOLN
1.0000 mg | INTRAMUSCULAR | Status: AC | PRN
  Administered 2013-12-19 (×3): 2 mg via INTRAVENOUS
  Filled 2013-12-19 (×4): qty 1

## 2013-12-19 MED ORDER — MIDAZOLAM HCL 2 MG/2ML IJ SOLN
INTRAMUSCULAR | Status: AC
  Filled 2013-12-19: qty 2

## 2013-12-19 MED ORDER — POTASSIUM CHLORIDE 10 MEQ/50ML IV SOLN
10.0000 meq | INTRAVENOUS | Status: AC
  Administered 2013-12-19 (×3): 10 meq via INTRAVENOUS

## 2013-12-19 MED ORDER — ROCURONIUM BROMIDE 100 MG/10ML IV SOLN
INTRAVENOUS | Status: DC | PRN
  Administered 2013-12-19: 40 mg via INTRAVENOUS
  Administered 2013-12-19: 50 mg via INTRAVENOUS
  Administered 2013-12-19: 100 mg via INTRAVENOUS
  Administered 2013-12-19 (×2): 50 mg via INTRAVENOUS

## 2013-12-19 MED ORDER — OXYCODONE HCL 5 MG PO TABS
5.0000 mg | ORAL_TABLET | ORAL | Status: DC | PRN
  Administered 2013-12-20 – 2013-12-21 (×11): 10 mg via ORAL
  Filled 2013-12-19 (×11): qty 2

## 2013-12-19 MED ORDER — 0.9 % SODIUM CHLORIDE (POUR BTL) OPTIME
TOPICAL | Status: DC | PRN
  Administered 2013-12-19: 1000 mL

## 2013-12-19 MED ORDER — DOPAMINE-DEXTROSE 3.2-5 MG/ML-% IV SOLN: 2.0000 ug/kg/min | INTRAVENOUS | Status: DC

## 2013-12-19 MED ORDER — PANTOPRAZOLE SODIUM 40 MG PO TBEC
40.0000 mg | DELAYED_RELEASE_TABLET | Freq: Every day | ORAL | Status: DC
  Administered 2013-12-21: 40 mg via ORAL
  Filled 2013-12-19: qty 1

## 2013-12-19 MED ORDER — BISACODYL 5 MG PO TBEC
10.0000 mg | DELAYED_RELEASE_TABLET | Freq: Every day | ORAL | Status: DC
  Administered 2013-12-20 – 2013-12-21 (×2): 10 mg via ORAL
  Filled 2013-12-19 (×2): qty 2

## 2013-12-19 MED ORDER — PROTAMINE SULFATE 10 MG/ML IV SOLN
INTRAVENOUS | Status: AC
  Filled 2013-12-19: qty 25

## 2013-12-19 MED ORDER — MORPHINE SULFATE 2 MG/ML IJ SOLN
2.0000 mg | INTRAMUSCULAR | Status: DC | PRN
  Administered 2013-12-20: 4 mg via INTRAVENOUS
  Administered 2013-12-20 (×3): 2 mg via INTRAVENOUS
  Administered 2013-12-20 (×3): 4 mg via INTRAVENOUS
  Administered 2013-12-20: 2 mg via INTRAVENOUS
  Administered 2013-12-21: 4 mg via INTRAVENOUS
  Administered 2013-12-21 – 2013-12-22 (×2): 2 mg via INTRAVENOUS
  Filled 2013-12-19: qty 2
  Filled 2013-12-19: qty 1
  Filled 2013-12-19: qty 2
  Filled 2013-12-19 (×2): qty 1
  Filled 2013-12-19 (×2): qty 2
  Filled 2013-12-19 (×2): qty 1
  Filled 2013-12-19 (×2): qty 2

## 2013-12-19 MED ORDER — ASPIRIN 81 MG PO CHEW: 324.0000 mg | CHEWABLE_TABLET | Freq: Every day | ORAL | Status: DC

## 2013-12-19 MED ORDER — FENTANYL CITRATE 0.05 MG/ML IJ SOLN
INTRAMUSCULAR | Status: DC | PRN
  Administered 2013-12-19: 500 ug via INTRAVENOUS
  Administered 2013-12-19 (×3): 250 ug via INTRAVENOUS
  Administered 2013-12-19: 500 ug via INTRAVENOUS

## 2013-12-19 MED ORDER — MIDAZOLAM HCL 2 MG/2ML IJ SOLN
2.0000 mg | INTRAMUSCULAR | Status: DC | PRN
Start: 2013-12-19 — End: 2013-12-20
  Administered 2013-12-19: 2 mg via INTRAVENOUS

## 2013-12-19 MED ORDER — PHENYLEPHRINE HCL 10 MG/ML IJ SOLN
0.0000 ug/min | INTRAMUSCULAR | Status: DC
  Filled 2013-12-19: qty 2

## 2013-12-19 MED ORDER — VANCOMYCIN HCL IN DEXTROSE 1-5 GM/200ML-% IV SOLN
1000.0000 mg | Freq: Once | INTRAVENOUS | Status: AC
  Administered 2013-12-19: 1000 mg via INTRAVENOUS
  Filled 2013-12-19: qty 200

## 2013-12-19 MED ORDER — MUPIROCIN 2 % EX OINT
1.0000 "application " | TOPICAL_OINTMENT | Freq: Two times a day (BID) | CUTANEOUS | Status: DC
  Administered 2013-12-19 – 2013-12-21 (×6): 1 via NASAL
  Filled 2013-12-19 (×2): qty 22

## 2013-12-19 MED ORDER — ALBUMIN HUMAN 5 % IV SOLN
250.0000 mL | INTRAVENOUS | Status: AC | PRN
  Administered 2013-12-19 – 2013-12-20 (×4): 250 mL via INTRAVENOUS
  Filled 2013-12-19 (×2): qty 250

## 2013-12-19 MED ORDER — SODIUM CHLORIDE 0.45 % IV SOLN
INTRAVENOUS | Status: DC
  Administered 2013-12-19: 20 mL/h via INTRAVENOUS

## 2013-12-19 MED ORDER — ROCURONIUM BROMIDE 50 MG/5ML IV SOLN
INTRAVENOUS | Status: AC
  Filled 2013-12-19: qty 1

## 2013-12-19 MED ORDER — SODIUM CHLORIDE 0.9 % IJ SOLN: 3.0000 mL | INTRAMUSCULAR | Status: DC | PRN

## 2013-12-19 MED ORDER — ATORVASTATIN CALCIUM 80 MG PO TABS
80.0000 mg | ORAL_TABLET | Freq: Every day | ORAL | Status: DC
  Administered 2013-12-20 – 2013-12-24 (×5): 80 mg via ORAL
  Filled 2013-12-19 (×6): qty 1

## 2013-12-19 MED ORDER — ACETAMINOPHEN 500 MG PO TABS
1000.0000 mg | ORAL_TABLET | Freq: Four times a day (QID) | ORAL | Status: DC
Start: 2013-12-20 — End: 2013-12-22
  Administered 2013-12-20 – 2013-12-22 (×10): 1000 mg via ORAL
  Filled 2013-12-19 (×13): qty 2

## 2013-12-19 MED ORDER — SODIUM CHLORIDE 0.9 % IV SOLN: INTRAVENOUS | Status: DC

## 2013-12-19 MED ORDER — METOPROLOL TARTRATE 12.5 MG HALF TABLET
12.5000 mg | ORAL_TABLET | Freq: Two times a day (BID) | ORAL | Status: DC
  Filled 2013-12-19 (×3): qty 1

## 2013-12-19 MED ORDER — ACETAMINOPHEN 160 MG/5ML PO SOLN
1000.0000 mg | Freq: Four times a day (QID) | ORAL | Status: DC
Start: 2013-12-20 — End: 2013-12-22

## 2013-12-19 MED ORDER — ASPIRIN EC 325 MG PO TBEC
325.0000 mg | DELAYED_RELEASE_TABLET | Freq: Every day | ORAL | Status: DC
  Administered 2013-12-20 – 2013-12-21 (×2): 325 mg via ORAL
  Filled 2013-12-19 (×3): qty 1

## 2013-12-19 MED ORDER — SODIUM CHLORIDE 0.9 % IV SOLN: 250.0000 mL | INTRAVENOUS | Status: DC

## 2013-12-19 MED ORDER — PROPOFOL 10 MG/ML IV BOLUS
INTRAVENOUS | Status: AC
  Filled 2013-12-19: qty 20

## 2013-12-19 MED ORDER — ALBUMIN HUMAN 5 % IV SOLN
INTRAVENOUS | Status: DC | PRN
  Administered 2013-12-19 (×2): via INTRAVENOUS

## 2013-12-19 MED ORDER — MAGNESIUM SULFATE 4000MG/100ML IJ SOLN
4.0000 g | Freq: Once | INTRAMUSCULAR | Status: AC
  Administered 2013-12-19: 4 g via INTRAVENOUS

## 2013-12-19 MED ORDER — SODIUM CHLORIDE 0.9 % IJ SOLN
3.0000 mL | Freq: Two times a day (BID) | INTRAMUSCULAR | Status: DC
  Administered 2013-12-20 – 2013-12-21 (×4): 3 mL via INTRAVENOUS

## 2013-12-19 MED ORDER — SODIUM CHLORIDE 0.9 % IJ SOLN
INTRAMUSCULAR | Status: AC
  Filled 2013-12-19: qty 10

## 2013-12-19 MED ORDER — CHLORHEXIDINE GLUCONATE CLOTH 2 % EX PADS
6.0000 | MEDICATED_PAD | Freq: Every day | CUTANEOUS | Status: DC
  Administered 2013-12-20 – 2013-12-21 (×2): 6 via TOPICAL

## 2013-12-19 MED ORDER — HEPARIN SODIUM (PORCINE) 1000 UNIT/ML IJ SOLN
INTRAMUSCULAR | Status: DC | PRN
  Administered 2013-12-19: 36000 [IU] via INTRAVENOUS

## 2013-12-19 MED ORDER — ACETAMINOPHEN 160 MG/5ML PO SOLN: 650.0000 mg | Freq: Once | ORAL | Status: AC

## 2013-12-19 MED ORDER — FAMOTIDINE IN NACL 20-0.9 MG/50ML-% IV SOLN
20.0000 mg | Freq: Two times a day (BID) | INTRAVENOUS | Status: AC
  Administered 2013-12-19: 20 mg via INTRAVENOUS

## 2013-12-19 MED ORDER — ACETAMINOPHEN 650 MG RE SUPP
650.0000 mg | Freq: Once | RECTAL | Status: AC
  Administered 2013-12-19: 650 mg via RECTAL

## 2013-12-19 MED ORDER — HEMOSTATIC AGENTS (NO CHARGE) OPTIME
TOPICAL | Status: DC | PRN
  Administered 2013-12-19: 1 via TOPICAL

## 2013-12-19 MED ORDER — PROPOFOL 10 MG/ML IV BOLUS
INTRAVENOUS | Status: DC | PRN
  Administered 2013-12-19: 100 mg via INTRAVENOUS

## 2013-12-19 MED ORDER — SODIUM CHLORIDE 0.9 % IV SOLN
INTRAVENOUS | Status: DC
  Administered 2013-12-20: 1.6 [IU]/h via INTRAVENOUS
  Filled 2013-12-19 (×2): qty 1

## 2013-12-19 MED ORDER — SODIUM CHLORIDE 0.9 % IJ SOLN
OROMUCOSAL | Status: DC | PRN
  Administered 2013-12-19: 08:00:00 via TOPICAL

## 2013-12-19 MED ORDER — LIDOCAINE HCL (CARDIAC) 20 MG/ML IV SOLN
INTRAVENOUS | Status: AC
  Filled 2013-12-19: qty 5

## 2013-12-19 MED ORDER — BISACODYL 10 MG RE SUPP: 10.0000 mg | Freq: Every day | RECTAL | Status: DC

## 2013-12-19 MED ORDER — MAGNESIUM SULFATE 40 MG/ML IJ SOLN
INTRAMUSCULAR | Status: AC
  Administered 2013-12-19: 4 g via INTRAVENOUS
  Filled 2013-12-19: qty 100

## 2013-12-19 MED ORDER — LACTATED RINGERS IV SOLN: 500.0000 mL | Freq: Once | INTRAVENOUS | Status: AC | PRN

## 2013-12-19 MED ORDER — MIDAZOLAM HCL 5 MG/5ML IJ SOLN
INTRAMUSCULAR | Status: DC | PRN
  Administered 2013-12-19 (×2): 5 mg via INTRAVENOUS

## 2013-12-19 MED ORDER — CHLORHEXIDINE GLUCONATE 0.12 % MT SOLN
15.0000 mL | Freq: Two times a day (BID) | OROMUCOSAL | Status: DC
  Administered 2013-12-19: 15 mL via OROMUCOSAL
  Filled 2013-12-19: qty 15

## 2013-12-19 MED ORDER — LACTATED RINGERS IV SOLN
INTRAVENOUS | Status: DC | PRN
  Administered 2013-12-19 (×4): via INTRAVENOUS

## 2013-12-19 MED ORDER — CETYLPYRIDINIUM CHLORIDE 0.05 % MT LIQD
7.0000 mL | Freq: Four times a day (QID) | OROMUCOSAL | Status: DC
  Administered 2013-12-20 – 2013-12-24 (×14): 7 mL via OROMUCOSAL

## 2013-12-19 MED ORDER — ROCURONIUM BROMIDE 50 MG/5ML IV SOLN
INTRAVENOUS | Status: AC
  Filled 2013-12-19: qty 2

## 2013-12-19 MED ORDER — MIDAZOLAM HCL 10 MG/2ML IJ SOLN
INTRAMUSCULAR | Status: AC
  Filled 2013-12-19: qty 2

## 2013-12-19 MED ORDER — METOPROLOL TARTRATE 1 MG/ML IV SOLN: 2.5000 mg | INTRAVENOUS | Status: DC | PRN

## 2013-12-19 MED ORDER — LACTATED RINGERS IV SOLN
INTRAVENOUS | Status: DC
  Administered 2013-12-19: 20 mL/h via INTRAVENOUS

## 2013-12-19 MED ORDER — NITROGLYCERIN IN D5W 200-5 MCG/ML-% IV SOLN
0.0000 ug/min | INTRAVENOUS | Status: DC
  Administered 2013-12-20: 5 ug/min via INTRAVENOUS

## 2013-12-19 MED ORDER — PROTAMINE SULFATE 10 MG/ML IV SOLN
INTRAVENOUS | Status: DC | PRN
  Administered 2013-12-19: 50 mg via INTRAVENOUS
  Administered 2013-12-19: 10 mg via INTRAVENOUS

## 2013-12-19 MED ORDER — DOCUSATE SODIUM 100 MG PO CAPS
200.0000 mg | ORAL_CAPSULE | Freq: Every day | ORAL | Status: DC
  Administered 2013-12-20 – 2013-12-21 (×2): 200 mg via ORAL
  Filled 2013-12-19 (×2): qty 2

## 2013-12-19 MED FILL — Electrolyte-R (PH 7.4) Solution: INTRAVENOUS | Qty: 5000 | Status: AC

## 2013-12-19 MED FILL — Mannitol IV Soln 20%: INTRAVENOUS | Qty: 500 | Status: AC

## 2013-12-19 MED FILL — Lidocaine HCl IV Inj 20 MG/ML: INTRAVENOUS | Qty: 5 | Status: AC

## 2013-12-19 MED FILL — Sodium Bicarbonate IV Soln 8.4%: INTRAVENOUS | Qty: 50 | Status: AC

## 2013-12-19 MED FILL — Sodium Chloride IV Soln 0.9%: INTRAVENOUS | Qty: 2000 | Status: AC

## 2013-12-19 MED FILL — Heparin Sodium (Porcine) Inj 1000 Unit/ML: INTRAMUSCULAR | Qty: 10 | Status: AC

## 2013-12-19 SURGICAL SUPPLY — 74 items
ATTRACTOMAT 16X20 MAGNETIC DRP (DRAPES) ×3 IMPLANT
BAG DECANTER FOR FLEXI CONT (MISCELLANEOUS) ×3 IMPLANT
BANDAGE ELASTIC 4 VELCRO ST LF (GAUZE/BANDAGES/DRESSINGS) ×3 IMPLANT
BANDAGE ELASTIC 6 VELCRO ST LF (GAUZE/BANDAGES/DRESSINGS) ×3 IMPLANT
BLADE STERNUM SYSTEM 6 (BLADE) ×3 IMPLANT
BNDG GAUZE ELAST 4 BULKY (GAUZE/BANDAGES/DRESSINGS) ×3 IMPLANT
CANISTER SUCTION 2500CC (MISCELLANEOUS) ×3 IMPLANT
CARDIAC SUCTION (MISCELLANEOUS) ×3 IMPLANT
CATH CPB KIT GERHARDT (MISCELLANEOUS) ×3 IMPLANT
CATH THORACIC 28FR (CATHETERS) ×3 IMPLANT
CLIP FOGARTY SPRING 6M (CLIP) ×3 IMPLANT
COVER SURGICAL LIGHT HANDLE (MISCELLANEOUS) ×3 IMPLANT
CRADLE DONUT ADULT HEAD (MISCELLANEOUS) ×3 IMPLANT
DRAIN CHANNEL 28F RND 3/8 FF (WOUND CARE) ×3 IMPLANT
DRAPE CARDIOVASCULAR INCISE (DRAPES) ×1
DRAPE SLUSH/WARMER DISC (DRAPES) ×3 IMPLANT
DRAPE SRG 135X102X78XABS (DRAPES) ×2 IMPLANT
DRSG AQUACEL AG ADV 3.5X14 (GAUZE/BANDAGES/DRESSINGS) ×3 IMPLANT
ELECT BLADE 4.0 EZ CLEAN MEGAD (MISCELLANEOUS) ×3
ELECT REM PT RETURN 9FT ADLT (ELECTROSURGICAL) ×6
ELECTRODE BLDE 4.0 EZ CLN MEGD (MISCELLANEOUS) ×2 IMPLANT
ELECTRODE REM PT RTRN 9FT ADLT (ELECTROSURGICAL) ×4 IMPLANT
GAUZE SPONGE 4X4 12PLY STRL (GAUZE/BANDAGES/DRESSINGS) ×6 IMPLANT
GLOVE BIO SURGEON STRL SZ 6 (GLOVE) ×6 IMPLANT
GLOVE BIO SURGEON STRL SZ 6.5 (GLOVE) ×30 IMPLANT
GLOVE BIO SURGEON STRL SZ7.5 (GLOVE) ×6 IMPLANT
GLOVE BIOGEL PI IND STRL 6 (GLOVE) ×4 IMPLANT
GLOVE BIOGEL PI IND STRL 6.5 (GLOVE) ×8 IMPLANT
GLOVE BIOGEL PI IND STRL 7.0 (GLOVE) ×4 IMPLANT
GLOVE BIOGEL PI INDICATOR 6 (GLOVE) ×2
GLOVE BIOGEL PI INDICATOR 6.5 (GLOVE) ×4
GLOVE BIOGEL PI INDICATOR 7.0 (GLOVE) ×2
GOWN STRL REUS W/ TWL LRG LVL3 (GOWN DISPOSABLE) ×16 IMPLANT
GOWN STRL REUS W/TWL LRG LVL3 (GOWN DISPOSABLE) ×8
HEMOSTAT POWDER SURGIFOAM 1G (HEMOSTASIS) ×9 IMPLANT
HEMOSTAT SURGICEL 2X14 (HEMOSTASIS) ×3 IMPLANT
KIT BASIN OR (CUSTOM PROCEDURE TRAY) ×3 IMPLANT
KIT ROOM TURNOVER OR (KITS) ×3 IMPLANT
KIT SUCTION CATH 14FR (SUCTIONS) ×6 IMPLANT
KIT VASOVIEW W/TROCAR VH 2000 (KITS) ×3 IMPLANT
LEAD PACING MYOCARDI (MISCELLANEOUS) ×3 IMPLANT
MARKER GRAFT CORONARY BYPASS (MISCELLANEOUS) ×12 IMPLANT
NS IRRIG 1000ML POUR BTL (IV SOLUTION) ×15 IMPLANT
PACK OPEN HEART (CUSTOM PROCEDURE TRAY) ×3 IMPLANT
PAD ARMBOARD 7.5X6 YLW CONV (MISCELLANEOUS) ×6 IMPLANT
PAD ELECT DEFIB RADIOL ZOLL (MISCELLANEOUS) ×3 IMPLANT
PENCIL BUTTON HOLSTER BLD 10FT (ELECTRODE) ×3 IMPLANT
PUNCH AORTIC ROTATE  4.5MM 8IN (MISCELLANEOUS) ×3 IMPLANT
SET CARDIOPLEGIA MPS 5001102 (MISCELLANEOUS) ×3 IMPLANT
SPONGE GAUZE 4X4 12PLY STER LF (GAUZE/BANDAGES/DRESSINGS) ×6 IMPLANT
SUT BONE WAX W31G (SUTURE) ×3 IMPLANT
SUT PROLENE 3 0 SH1 36 (SUTURE) ×3 IMPLANT
SUT PROLENE 4 0 TF (SUTURE) ×6 IMPLANT
SUT PROLENE 6 0 CC (SUTURE) ×12 IMPLANT
SUT PROLENE 7 0 BV1 MDA (SUTURE) ×6 IMPLANT
SUT PROLENE 8 0 BV175 6 (SUTURE) ×6 IMPLANT
SUT STEEL 6MS V (SUTURE) ×3 IMPLANT
SUT STEEL SZ 6 DBL 3X14 BALL (SUTURE) ×3 IMPLANT
SUT VIC AB 1 CTX 18 (SUTURE) ×6 IMPLANT
SUT VIC AB 2-0 CT1 27 (SUTURE) ×2
SUT VIC AB 2-0 CT1 TAPERPNT 27 (SUTURE) ×4 IMPLANT
SUT VIC AB 3-0 X1 27 (SUTURE) ×6 IMPLANT
SUTURE E-PAK OPEN HEART (SUTURE) ×3 IMPLANT
SYSTEM SAHARA CHEST DRAIN ATS (WOUND CARE) ×3 IMPLANT
TAPE CLOTH SURG 4X10 WHT LF (GAUZE/BANDAGES/DRESSINGS) ×3 IMPLANT
TAPE PAPER 2X10 WHT MICROPORE (GAUZE/BANDAGES/DRESSINGS) ×3 IMPLANT
TOWEL OR 17X24 6PK STRL BLUE (TOWEL DISPOSABLE) ×6 IMPLANT
TOWEL OR 17X26 10 PK STRL BLUE (TOWEL DISPOSABLE) ×6 IMPLANT
TRAY FOLEY IC TEMP SENS 16FR (CATHETERS) ×3 IMPLANT
TUBE FEEDING 8FR 16IN STR KANG (MISCELLANEOUS) ×3 IMPLANT
TUBING INSUFFLATION 10FT LAP (TUBING) ×3 IMPLANT
UNDERPAD 30X30 INCONTINENT (UNDERPADS AND DIAPERS) ×3 IMPLANT
WATER STERILE IRR 1000ML POUR (IV SOLUTION) ×6 IMPLANT
YANKAUER SUCT BULB TIP NO VENT (SUCTIONS) ×3 IMPLANT

## 2013-12-19 NOTE — Progress Notes (Signed)
S/p CABG  Intubated but starting to wake up  Moving all 4 and following commands  BP 94/54  Pulse 79  Temp(Src) 98.1 F (36.7 C) (Core (Comment))  Resp 20  Ht 5\' 6"  (1.676 m)  Wt 226 lb 8 oz (102.74 kg)  BMI 36.58 kg/m2  SpO2 95%   Intake/Output Summary (Last 24 hours) at 12/19/13 1805 Last data filed at 12/19/13 1800  Gross per 24 hour  Intake 5190.51 ml  Output   4560 ml  Net 630.51 ml    Minimal CT output  Doing well early postop

## 2013-12-19 NOTE — Anesthesia Preprocedure Evaluation (Addendum)
Anesthesia Evaluation  Patient identified by MRN, date of birth, ID band Patient awake    Reviewed: Allergy & Precautions, H&P , NPO status , Patient's Chart, lab work & pertinent test results  Airway Mallampati: II TM Distance: >3 FB Neck ROM: Full    Dental  (+) Teeth Intact   Pulmonary sleep apnea ,    Pulmonary exam normal       Cardiovascular + angina + CAD     Neuro/Psych    GI/Hepatic negative GI ROS, Neg liver ROS,   Endo/Other  Morbid obesity  Renal/GU negative Renal ROS     Musculoskeletal negative musculoskeletal ROS (+)   Abdominal   Peds  Hematology   Anesthesia Other Findings   Reproductive/Obstetrics                          Anesthesia Physical Anesthesia Plan  ASA: IV  Anesthesia Plan: General   Post-op Pain Management:    Induction: Intravenous  Airway Management Planned: Oral ETT  Additional Equipment: Arterial line and PA Cath  Intra-op Plan:   Post-operative Plan: Post-operative intubation/ventilation  Informed Consent: I have reviewed the patients History and Physical, chart, labs and discussed the procedure including the risks, benefits and alternatives for the proposed anesthesia with the patient or authorized representative who has indicated his/her understanding and acceptance.   Dental advisory given  Plan Discussed with: CRNA, Anesthesiologist and Surgeon  Anesthesia Plan Comments:         Anesthesia Quick Evaluation

## 2013-12-19 NOTE — Anesthesia Postprocedure Evaluation (Signed)
Anesthesia Post Note  Patient: Robert Decker  Procedure(s) Performed: Procedure(s) (LRB): CORONARY ARTERY BYPASS GRAFTING (CABG) x5 using right and left greater saphenous thigh vein and left internal mammary artery LIMA-LAD; SEQ SVG-OM1-OM2; SVG-PD; SVG-DIAG (N/A) INTRAOPERATIVE TRANSESOPHAGEAL ECHOCARDIOGRAM (N/A)  Anesthesia type: General  Patient location: ICU  Post pain: Pain level controlled  Post assessment: Post-op Vital signs reviewed  Last Vitals:  Filed Vitals:   12/19/13 1501  BP: 120/62  Pulse: 90  Temp:   Resp: 12    Post vital signs: stable  Level of consciousness: Patient remains intubated per anesthesia plan  Complications: No apparent anesthesia complications

## 2013-12-19 NOTE — Brief Op Note (Addendum)
      [address]       [place] [address]             [phone]     12/16/2013 - 12/19/2013  2:37 PM  PATIENT:  Robert Decker  63 y.o. male  PRE-OPERATIVE DIAGNOSIS:  CAD   POST-OPERATIVE DIAGNOSIS:  CAD  PROCEDURE:  Procedure(s): CORONARY ARTERY BYPASS GRAFTING (CABG)x5 LIMA-LAD; SEQ SVG-OM1-OM2; SVG-PD; SVG-DIAG INTRAOPERATIVE TRANSESOPHAGEAL ECHOCARDIOGRAM EVH BILAT THOGHS  SURGEON:  Surgeon(s): [name], MD  PHYSICIAN ASSISTANT: WAYNE GOLD PA-C  ANESTHESIA:   general  PATIENT CONDITION:  ICU - intubated and hemodynamically stable.  PRE-OPERATIVE WEIGHT: 606TK  COMPLICATIONS: NO KNOWN

## 2013-12-19 NOTE — Progress Notes (Signed)
*  PRELIMINARY RESULTS* Echocardiogram Echocardiogram Transesophageal has been performed.  Leavy Cella 12/19/2013, 8:29 AM

## 2013-12-19 NOTE — Transfer of Care (Signed)
Immediate Anesthesia Transfer of Care Note  Patient: Robert Decker  Procedure(s) Performed: Procedure(s): CORONARY ARTERY BYPASS GRAFTING (CABG) x5 using right and left greater saphenous thigh vein and left internal mammary artery LIMA-LAD; SEQ SVG-OM1-OM2; SVG-PD; SVG-DIAG (N/A) INTRAOPERATIVE TRANSESOPHAGEAL ECHOCARDIOGRAM (N/A)  Patient Location: ICU  Anesthesia Type:General  Level of Consciousness: Patient remains intubated per anesthesia plan  Airway & Oxygen Therapy: Patient remains intubated per anesthesia plan and Patient placed on Ventilator (see vital sign flow sheet for setting)  Post-op Assessment: Report given to PACU RN  Post vital signs: Reviewed and stable  Complications: No apparent anesthesia complications

## 2013-12-19 NOTE — Procedures (Signed)
Extubation Procedure Note  Patient Details:   Name: Robert Decker DOB: April 13, 1951 MRN: [id number]   Airway Documentation:     Evaluation  O2 sats: stable throughout Complications: No apparent complications Patient did tolerate procedure well. Bilateral Breath Sounds: Clear;Diminished   Yes pt able to vocalize.   Pt extubated at this time per Rapid wean protocol. Pt tolerated well, was placed on 4L [place]. VS stable at this time. VC 0.7L, Nif -20 cm H2O. Pt able to breathe around deflated cuff. No complications or stridor noted. Pt has strong, adequate cough. IS performed 500-722mLx 5. RT will continue to monitor.   Irineo Axon [facility] 12/19/2013, 6:39 PM

## 2013-12-20 ENCOUNTER — Inpatient Hospital Stay (HOSPITAL_COMMUNITY): Payer: 59

## 2013-12-20 LAB — GLUCOSE, CAPILLARY
GLUCOSE-CAPILLARY: 133 mg/dL — AB (ref 70–99)
GLUCOSE-CAPILLARY: 134 mg/dL — AB (ref 70–99)
GLUCOSE-CAPILLARY: 115 mg/dL — AB (ref 70–99)
GLUCOSE-CAPILLARY: 115 mg/dL — AB (ref 70–99)
GLUCOSE-CAPILLARY: 122 mg/dL — AB (ref 70–99)
Glucose-Capillary: 114 mg/dL — ABNORMAL HIGH (ref 70–99)
Glucose-Capillary: 114 mg/dL — ABNORMAL HIGH (ref 70–99)
Glucose-Capillary: 125 mg/dL — ABNORMAL HIGH (ref 70–99)
Glucose-Capillary: 125 mg/dL — ABNORMAL HIGH (ref 70–99)
Glucose-Capillary: 133 mg/dL — ABNORMAL HIGH (ref 70–99)
Glucose-Capillary: 137 mg/dL — ABNORMAL HIGH (ref 70–99)
Glucose-Capillary: 99 mg/dL (ref 70–99)

## 2013-12-20 LAB — CBC
HCT: 36.5 % — ABNORMAL LOW (ref 39.0–52.0)
HEMATOCRIT: 36.9 % — AB (ref 39.0–52.0)
HEMOGLOBIN: 12.7 g/dL — AB (ref 13.0–17.0)
HEMOGLOBIN: 12.6 g/dL — AB (ref 13.0–17.0)
MCH: 31.8 pg (ref 26.0–34.0)
MCH: 31 pg (ref 26.0–34.0)
MCHC: 34.8 g/dL (ref 30.0–36.0)
MCHC: 34.1 g/dL (ref 30.0–36.0)
MCV: 91.3 fL (ref 78.0–100.0)
MCV: 90.7 fL (ref 78.0–100.0)
Platelets: 125 10*3/uL — ABNORMAL LOW (ref 150–400)
Platelets: 119 10*3/uL — ABNORMAL LOW (ref 150–400)
RBC: 4 MIL/uL — AB (ref 4.22–5.81)
RBC: 4.07 MIL/uL — ABNORMAL LOW (ref 4.22–5.81)
RDW: 12.5 % (ref 11.5–15.5)
RDW: 12.8 % (ref 11.5–15.5)
WBC: 10.3 10*3/uL (ref 4.0–10.5)
WBC: 14 10*3/uL — ABNORMAL HIGH (ref 4.0–10.5)

## 2013-12-20 LAB — CREATININE, SERUM
Creatinine, Ser: 1.14 mg/dL (ref 0.50–1.35)
GFR calc non Af Amer: 67 mL/min — ABNORMAL LOW (ref >90)
GFR, EST AFRICAN AMERICAN: 78 mL/min — AB (ref >90)

## 2013-12-20 LAB — BASIC METABOLIC PANEL
Anion gap: 11 (ref 5–15)
BUN: 17 mg/dL (ref 6–23)
CO2: 22 meq/L (ref 19–32)
Calcium: 7.6 mg/dL — ABNORMAL LOW (ref 8.4–10.5)
Chloride: 107 mEq/L (ref 96–112)
Creatinine, Ser: 0.96 mg/dL (ref 0.50–1.35)
GFR calc Af Amer: >90 mL/min (ref >90)
GFR, EST NON AFRICAN AMERICAN: 87 mL/min — AB (ref >90)
Glucose, Bld: 115 mg/dL — ABNORMAL HIGH (ref 70–99)
POTASSIUM: 4.5 meq/L (ref 3.7–5.3)
SODIUM: 140 meq/L (ref 137–147)

## 2013-12-20 LAB — MAGNESIUM
MAGNESIUM: 2.6 mg/dL — AB (ref 1.5–2.5)
Magnesium: 2.5 mg/dL (ref 1.5–2.5)

## 2013-12-20 MED ORDER — FUROSEMIDE 10 MG/ML IJ SOLN
40.0000 mg | Freq: Once | INTRAMUSCULAR | Status: AC
  Administered 2013-12-20: 40 mg via INTRAVENOUS
  Filled 2013-12-20: qty 4

## 2013-12-20 MED ORDER — INSULIN ASPART 100 UNIT/ML ~~LOC~~ SOLN
0.0000 [IU] | SUBCUTANEOUS | Status: DC
  Administered 2013-12-20 – 2013-12-21 (×5): 2 [IU] via SUBCUTANEOUS

## 2013-12-20 MED ORDER — INSULIN DETEMIR 100 UNIT/ML ~~LOC~~ SOLN
35.0000 [IU] | Freq: Every day | SUBCUTANEOUS | Status: DC
  Administered 2013-12-21: 35 [IU] via SUBCUTANEOUS
  Filled 2013-12-20 (×2): qty 0.35

## 2013-12-20 MED ORDER — FUROSEMIDE 10 MG/ML IJ SOLN
20.0000 mg | Freq: Once | INTRAMUSCULAR | Status: AC
  Administered 2013-12-20: 20 mg via INTRAVENOUS
  Filled 2013-12-20: qty 2

## 2013-12-20 MED ORDER — METOPROLOL TARTRATE 25 MG/10 ML ORAL SUSPENSION
25.0000 mg | Freq: Two times a day (BID) | ORAL | Status: DC
  Filled 2013-12-20 (×5): qty 10

## 2013-12-20 MED ORDER — ENOXAPARIN SODIUM 40 MG/0.4ML ~~LOC~~ SOLN
40.0000 mg | Freq: Every day | SUBCUTANEOUS | Status: DC
  Administered 2013-12-20 – 2013-12-24 (×5): 40 mg via SUBCUTANEOUS
  Filled 2013-12-20 (×6): qty 0.4

## 2013-12-20 MED ORDER — ALBUTEROL SULFATE (2.5 MG/3ML) 0.083% IN NEBU
2.5000 mg | INHALATION_SOLUTION | Freq: Four times a day (QID) | RESPIRATORY_TRACT | Status: DC | PRN
  Administered 2013-12-20 – 2013-12-22 (×4): 2.5 mg via RESPIRATORY_TRACT
  Filled 2013-12-20 (×4): qty 3

## 2013-12-20 MED ORDER — METOPROLOL TARTRATE 25 MG PO TABS
25.0000 mg | ORAL_TABLET | Freq: Two times a day (BID) | ORAL | Status: DC
  Administered 2013-12-20 – 2013-12-21 (×3): 25 mg via ORAL
  Filled 2013-12-20 (×5): qty 1

## 2013-12-20 MED ORDER — METOCLOPRAMIDE HCL 5 MG/ML IJ SOLN
10.0000 mg | Freq: Three times a day (TID) | INTRAMUSCULAR | Status: AC
  Administered 2013-12-20 – 2013-12-21 (×3): 10 mg via INTRAVENOUS
  Filled 2013-12-20 (×3): qty 2

## 2013-12-20 MED ORDER — ALBUMIN HUMAN 5 % IV SOLN
12.5000 g | Freq: Once | INTRAVENOUS | Status: AC
  Administered 2013-12-20: 12.5 g via INTRAVENOUS
  Filled 2013-12-20: qty 250

## 2013-12-20 MED ORDER — INSULIN DETEMIR 100 UNIT/ML ~~LOC~~ SOLN
35.0000 [IU] | Freq: Once | SUBCUTANEOUS | Status: AC
  Administered 2013-12-20: 35 [IU] via SUBCUTANEOUS
  Filled 2013-12-20: qty 0.35

## 2013-12-20 MED ORDER — DOPAMINE-DEXTROSE 3.2-5 MG/ML-% IV SOLN
2.0000 ug/kg/min | INTRAVENOUS | Status: DC
Start: 2013-12-20 — End: 2013-12-22

## 2013-12-20 NOTE — Progress Notes (Signed)
Comfortable  BP 119/64  Pulse 81  Temp(Src) 98 F (36.7 C) (Oral)  Resp 15  Ht 5\' 6"  (1.676 m)  Wt 237 lb 10.5 oz (107.8 kg)  BMI 38.38 kg/m2  SpO2 94%  Intake/Output Summary (Last 24 hours) at 12/20/13 1820 Last data filed at 12/20/13 1700  Gross per 24 hour  Intake 3992.3 ml  Output   1420 ml  Net 2572.3 ml     Wheezing earlier during walk- will give PRn albuterol  BP up- give additional lasix, metoprolol 25 BID

## 2013-12-21 ENCOUNTER — Inpatient Hospital Stay (HOSPITAL_COMMUNITY): Payer: 59

## 2013-12-21 LAB — GLUCOSE, CAPILLARY
GLUCOSE-CAPILLARY: 131 mg/dL — AB (ref 70–99)
Glucose-Capillary: 109 mg/dL — ABNORMAL HIGH (ref 70–99)
Glucose-Capillary: 125 mg/dL — ABNORMAL HIGH (ref 70–99)
Glucose-Capillary: 133 mg/dL — ABNORMAL HIGH (ref 70–99)
Glucose-Capillary: 98 mg/dL (ref 70–99)

## 2013-12-21 LAB — CBC
HEMATOCRIT: 35.5 % — AB (ref 39.0–52.0)
Hemoglobin: 12.2 g/dL — ABNORMAL LOW (ref 13.0–17.0)
MCH: 31.7 pg (ref 26.0–34.0)
MCHC: 34.4 g/dL (ref 30.0–36.0)
MCV: 92.2 fL (ref 78.0–100.0)
PLATELETS: 126 10*3/uL — AB (ref 150–400)
RBC: 3.85 MIL/uL — AB (ref 4.22–5.81)
RDW: 13.1 % (ref 11.5–15.5)
WBC: 17.1 10*3/uL — AB (ref 4.0–10.5)

## 2013-12-21 LAB — BASIC METABOLIC PANEL
Anion gap: 11 (ref 5–15)
BUN: 21 mg/dL (ref 6–23)
CO2: 25 meq/L (ref 19–32)
Calcium: 8.5 mg/dL (ref 8.4–10.5)
Chloride: 99 mEq/L (ref 96–112)
Creatinine, Ser: 1.04 mg/dL (ref 0.50–1.35)
GFR calc Af Amer: 87 mL/min — ABNORMAL LOW (ref >90)
GFR calc non Af Amer: 75 mL/min — ABNORMAL LOW (ref >90)
Glucose, Bld: 133 mg/dL — ABNORMAL HIGH (ref 70–99)
POTASSIUM: 4.3 meq/L (ref 3.7–5.3)
SODIUM: 135 meq/L — AB (ref 137–147)

## 2013-12-21 MED ORDER — INSULIN ASPART 100 UNIT/ML ~~LOC~~ SOLN: 0.0000 [IU] | Freq: Three times a day (TID) | SUBCUTANEOUS | Status: DC

## 2013-12-21 MED ORDER — GUAIFENESIN ER 600 MG PO TB12
1200.0000 mg | ORAL_TABLET | Freq: Two times a day (BID) | ORAL | Status: DC
  Administered 2013-12-21 (×2): 1200 mg via ORAL
  Filled 2013-12-21 (×4): qty 2

## 2013-12-21 MED ORDER — FUROSEMIDE 10 MG/ML IJ SOLN
40.0000 mg | Freq: Once | INTRAMUSCULAR | Status: AC
  Administered 2013-12-21: 40 mg via INTRAVENOUS
  Filled 2013-12-21: qty 4

## 2013-12-21 NOTE — Progress Notes (Signed)
2 Days Post-Op Procedure(s) (LRB): CORONARY ARTERY BYPASS GRAFTING (CABG) x5 using right and left greater saphenous thigh vein and left internal mammary artery LIMA-LAD; SEQ SVG-OM1-OM2; SVG-PD; SVG-DIAG (N/A) INTRAOPERATIVE TRANSESOPHAGEAL ECHOCARDIOGRAM (N/A) Subjective: Noted to be wheezing and requires increased O2 from 2 to 6 liters after walk this AM Currently comfortable  Objective: Vital signs in last 24 hours: Temp:  [98 F (36.7 C)-99.5 F (37.5 C)] 98.4 F (36.9 C) (08/16 0750) Pulse Rate:  [76-119] 87 (08/16 0700) Cardiac Rhythm:  [-] Normal sinus rhythm (08/15 2000) Resp:  [10-31] 15 (08/16 0700) BP: (105-132)/(50-107) 123/59 mmHg (08/16 0700) SpO2:  [89 %-96 %] 93 % (08/16 0700) Arterial Line BP: (104-166)/(53-72) 125/65 mmHg (08/15 1800) Weight:  [239 lb 13.8 oz (108.8 kg)] 239 lb 13.8 oz (108.8 kg) (08/16 0700)  Hemodynamic parameters for last 24 hours: PAP: (34-50)/(13-34) 50/34 mmHg  Intake/Output from previous day: 08/15 0701 - 08/16 0700 In: 2830.6 [P.O.:2280; I.V.:450.6; IV Piggyback:100] Out: 1475 [Urine:1390; Chest Tube:85] Intake/Output this shift:    General appearance: alert and no distress Neurologic: intact Heart: regular rate and rhythm Lungs: diminished breath sounds bibasilar Abdomen: normal findings: +BS, less distended  Lab Results:  Recent Labs  12/20/13 1800 12/21/13 0400  WBC 14.0* 17.1*  HGB 12.6* 12.2*  HCT 36.9* 35.5*  PLT 119* 126*   BMET:  Recent Labs  12/20/13 0350 12/20/13 1800 12/21/13 0400  NA 140  --  135*  K 4.5  --  4.3  CL 107  --  99  CO2 22  --  25  GLUCOSE 115*  --  133*  BUN 17  --  21  CREATININE 0.96 1.14 1.04  CALCIUM 7.6*  --  8.5    PT/INR:  Recent Labs  12/19/13 1500  LABPROT 17.7*  INR 1.46   ABG    Component Value Date/Time   PHART 7.302* 12/19/2013 1933   HCO3 23.2 12/19/2013 1933   TCO2 20 12/19/2013 2104   ACIDBASEDEF 3.0* 12/19/2013 1933   O2SAT 95.0 12/19/2013 1933   CBG (last  3)   Recent Labs  12/20/13 2004 12/21/13 0004 12/21/13 0338  GLUCAP 122* 125* 131*    Assessment/Plan: S/P Procedure(s) (LRB): CORONARY ARTERY BYPASS GRAFTING (CABG) x5 using right and left greater saphenous thigh vein and left internal mammary artery LIMA-LAD; SEQ SVG-OM1-OM2; SVG-PD; SVG-DIAG (N/A) INTRAOPERATIVE TRANSESOPHAGEAL ECHOCARDIOGRAM (N/A) POD # 2 CV- stable in SR on lopressor  RESP_ CXR shows some atelectasis v infiltrate bilaterally  PRN albuterol  Add mucinex and flutter valve  RENAL- weight up and still edematous- IV lasix again this morning  Leukocytosis- WBC trending up, afebrile but did have a fever the 1st night postop- likely a SIRS type reaction to CPB  CBG well controlled  GI- stomach less distended on exam- will try to advance diet today  Overall looks good but need to improve pulmonary status before transferring   LOS: 5 days    Robert Decker C 12/21/2013

## 2013-12-21 NOTE — Progress Notes (Signed)
Looks more comfortable this PM  Desaturated after walk this afternoon but recovered more quickly  BP 119/55  Pulse 77  Temp(Src) 98.7 F (37.1 C) (Oral)  Resp 19  Ht 5\' 6"  (1.676 m)  Wt 239 lb 13.8 oz (108.8 kg)  BMI 38.73 kg/m2  SpO2 95%   Intake/Output Summary (Last 24 hours) at 12/21/13 1937 Last data filed at 12/21/13 1600  Gross per 24 hour  Intake 2578.67 ml  Output   1920 ml  Net 658.67 ml    Doing well should be able to transfer to 2000 in AM

## 2013-12-22 ENCOUNTER — Inpatient Hospital Stay (HOSPITAL_COMMUNITY): Payer: 59

## 2013-12-22 LAB — BASIC METABOLIC PANEL
Anion gap: 6 (ref 5–15)
BUN: 17 mg/dL (ref 6–23)
CO2: 30 meq/L (ref 19–32)
Calcium: 8.3 mg/dL — ABNORMAL LOW (ref 8.4–10.5)
Chloride: 100 mEq/L (ref 96–112)
Creatinine, Ser: 0.96 mg/dL (ref 0.50–1.35)
GFR calc Af Amer: >90 mL/min (ref >90)
GFR, EST NON AFRICAN AMERICAN: 87 mL/min — AB (ref >90)
Glucose, Bld: 113 mg/dL — ABNORMAL HIGH (ref 70–99)
POTASSIUM: 4.1 meq/L (ref 3.7–5.3)
SODIUM: 136 meq/L — AB (ref 137–147)

## 2013-12-22 LAB — CBC
HEMATOCRIT: 33.1 % — AB (ref 39.0–52.0)
HEMOGLOBIN: 11.1 g/dL — AB (ref 13.0–17.0)
MCH: 30.8 pg (ref 26.0–34.0)
MCHC: 33.5 g/dL (ref 30.0–36.0)
MCV: 91.9 fL (ref 78.0–100.0)
Platelets: 120 10*3/uL — ABNORMAL LOW (ref 150–400)
RBC: 3.6 MIL/uL — AB (ref 4.22–5.81)
RDW: 13.1 % (ref 11.5–15.5)
WBC: 10.8 10*3/uL — ABNORMAL HIGH (ref 4.0–10.5)

## 2013-12-22 LAB — GLUCOSE, CAPILLARY
GLUCOSE-CAPILLARY: 110 mg/dL — AB (ref 70–99)
GLUCOSE-CAPILLARY: 119 mg/dL — AB (ref 70–99)
GLUCOSE-CAPILLARY: 116 mg/dL — AB (ref 70–99)
Glucose-Capillary: 120 mg/dL — ABNORMAL HIGH (ref 70–99)
Glucose-Capillary: 128 mg/dL — ABNORMAL HIGH (ref 70–99)
Glucose-Capillary: 127 mg/dL — ABNORMAL HIGH (ref 70–99)
Glucose-Capillary: 25 mg/dL — CL (ref 70–99)
Glucose-Capillary: 117 mg/dL — ABNORMAL HIGH (ref 70–99)
Glucose-Capillary: 95 mg/dL (ref 70–99)
Glucose-Capillary: 119 mg/dL — ABNORMAL HIGH (ref 70–99)
Glucose-Capillary: 121 mg/dL — ABNORMAL HIGH (ref 70–99)

## 2013-12-22 LAB — MAGNESIUM: Magnesium: 2.4 mg/dL (ref 1.5–2.5)

## 2013-12-22 LAB — POCT I-STAT, CHEM 8
BUN: 21 mg/dL (ref 6–23)
Calcium, Ion: 1.19 mmol/L (ref 1.13–1.30)
Chloride: 99 mEq/L (ref 96–112)
Creatinine, Ser: 1.2 mg/dL (ref 0.50–1.35)
Glucose, Bld: 140 mg/dL — ABNORMAL HIGH (ref 70–99)
HCT: 38 % — ABNORMAL LOW (ref 39.0–52.0)
Hemoglobin: 12.9 g/dL — ABNORMAL LOW (ref 13.0–17.0)
POTASSIUM: 4.2 meq/L (ref 3.7–5.3)
Sodium: 136 mEq/L — ABNORMAL LOW (ref 137–147)
TCO2: 22 mmol/L (ref 0–100)

## 2013-12-22 MED ORDER — METOPROLOL TARTRATE 12.5 MG HALF TABLET
12.5000 mg | ORAL_TABLET | Freq: Two times a day (BID) | ORAL | Status: DC
  Administered 2013-12-22: 12.5 mg via ORAL
  Filled 2013-12-22 (×2): qty 1

## 2013-12-22 MED ORDER — MOVING RIGHT ALONG BOOK
Freq: Once | Status: AC
Start: 2013-12-22 — End: 2013-12-22
  Administered 2013-12-22: 08:00:00
  Filled 2013-12-22: qty 1

## 2013-12-22 MED ORDER — AMIODARONE HCL IN DEXTROSE 360-4.14 MG/200ML-% IV SOLN
60.0000 mg/h | INTRAVENOUS | Status: AC
  Administered 2013-12-22: 60 mg/h via INTRAVENOUS

## 2013-12-22 MED ORDER — SODIUM CHLORIDE 0.9 % IJ SOLN
3.0000 mL | Freq: Two times a day (BID) | INTRAMUSCULAR | Status: DC
  Administered 2013-12-22 – 2013-12-25 (×6): 3 mL via INTRAVENOUS

## 2013-12-22 MED ORDER — INSULIN DETEMIR 100 UNIT/ML ~~LOC~~ SOLN
20.0000 [IU] | Freq: Every day | SUBCUTANEOUS | Status: DC
  Administered 2013-12-22 – 2013-12-23 (×2): 20 [IU] via SUBCUTANEOUS
  Filled 2013-12-22 (×3): qty 0.2

## 2013-12-22 MED ORDER — OXYCODONE HCL 5 MG PO TABS
5.0000 mg | ORAL_TABLET | ORAL | Status: DC | PRN
  Administered 2013-12-22 – 2013-12-25 (×18): 10 mg via ORAL
  Filled 2013-12-22 (×18): qty 2

## 2013-12-22 MED ORDER — TRAMADOL HCL 50 MG PO TABS
50.0000 mg | ORAL_TABLET | ORAL | Status: DC | PRN
  Administered 2013-12-22: 100 mg via ORAL
  Filled 2013-12-22: qty 2

## 2013-12-22 MED ORDER — AMIODARONE HCL IN DEXTROSE 360-4.14 MG/200ML-% IV SOLN
30.0000 mg/h | INTRAVENOUS | Status: DC
  Filled 2013-12-22 (×6): qty 200

## 2013-12-22 MED ORDER — SODIUM CHLORIDE 0.9 % IJ SOLN: 3.0000 mL | INTRAMUSCULAR | Status: DC | PRN

## 2013-12-22 MED ORDER — AMIODARONE HCL 200 MG PO TABS: 400.0000 mg | ORAL_TABLET | Freq: Every day | ORAL | Status: DC

## 2013-12-22 MED ORDER — AMIODARONE HCL 200 MG PO TABS
400.0000 mg | ORAL_TABLET | Freq: Two times a day (BID) | ORAL | Status: DC
  Filled 2013-12-22: qty 2

## 2013-12-22 MED ORDER — SODIUM CHLORIDE 0.9 % IV SOLN: 250.0000 mL | INTRAVENOUS | Status: DC | PRN

## 2013-12-22 MED ORDER — AMIODARONE HCL IN DEXTROSE 360-4.14 MG/200ML-% IV SOLN
INTRAVENOUS | Status: AC
  Administered 2013-12-22: 60 mg/h via INTRAVENOUS
  Filled 2013-12-22: qty 400

## 2013-12-22 MED ORDER — BISACODYL 5 MG PO TBEC: 10.0000 mg | DELAYED_RELEASE_TABLET | Freq: Every day | ORAL | Status: DC | PRN

## 2013-12-22 MED ORDER — LEVALBUTEROL HCL 0.63 MG/3ML IN NEBU
0.6300 mg | INHALATION_SOLUTION | Freq: Four times a day (QID) | RESPIRATORY_TRACT | Status: DC | PRN
  Administered 2013-12-22 – 2013-12-23 (×2): 0.63 mg via RESPIRATORY_TRACT
  Filled 2013-12-22 (×2): qty 3

## 2013-12-22 MED ORDER — DOCUSATE SODIUM 100 MG PO CAPS
200.0000 mg | ORAL_CAPSULE | Freq: Every day | ORAL | Status: DC
  Administered 2013-12-22 – 2013-12-25 (×4): 200 mg via ORAL
  Filled 2013-12-22 (×4): qty 2

## 2013-12-22 MED ORDER — BISACODYL 10 MG RE SUPP: 10.0000 mg | Freq: Every day | RECTAL | Status: DC | PRN

## 2013-12-22 MED ORDER — INSULIN ASPART 100 UNIT/ML ~~LOC~~ SOLN
0.0000 [IU] | Freq: Three times a day (TID) | SUBCUTANEOUS | Status: DC
  Administered 2013-12-22: 2 [IU] via SUBCUTANEOUS

## 2013-12-22 MED ORDER — ONDANSETRON HCL 4 MG/2ML IJ SOLN: 4.0000 mg | Freq: Four times a day (QID) | INTRAMUSCULAR | Status: DC | PRN

## 2013-12-22 MED ORDER — AMIODARONE HCL 200 MG PO TABS
200.0000 mg | ORAL_TABLET | Freq: Two times a day (BID) | ORAL | Status: DC
  Administered 2013-12-22 – 2013-12-24 (×5): 200 mg via ORAL
  Filled 2013-12-22 (×5): qty 1

## 2013-12-22 MED ORDER — ASPIRIN EC 325 MG PO TBEC
325.0000 mg | DELAYED_RELEASE_TABLET | Freq: Every day | ORAL | Status: DC
  Administered 2013-12-22 – 2013-12-25 (×4): 325 mg via ORAL
  Filled 2013-12-22 (×4): qty 1

## 2013-12-22 MED ORDER — ONDANSETRON HCL 4 MG PO TABS: 4.0000 mg | ORAL_TABLET | Freq: Four times a day (QID) | ORAL | Status: DC | PRN

## 2013-12-22 MED ORDER — AMIODARONE LOAD VIA INFUSION
150.0000 mg | Freq: Once | INTRAVENOUS | Status: AC
  Administered 2013-12-22: 150 mg via INTRAVENOUS
  Filled 2013-12-22: qty 83.34

## 2013-12-22 MED ORDER — METOPROLOL TARTRATE 25 MG PO TABS
25.0000 mg | ORAL_TABLET | Freq: Two times a day (BID) | ORAL | Status: DC
  Administered 2013-12-22 – 2013-12-23 (×3): 25 mg via ORAL
  Filled 2013-12-22 (×5): qty 1

## 2013-12-22 MED ORDER — GUAIFENESIN ER 600 MG PO TB12
600.0000 mg | ORAL_TABLET | Freq: Two times a day (BID) | ORAL | Status: DC | PRN
  Administered 2013-12-23: 600 mg via ORAL
  Filled 2013-12-22 (×2): qty 1

## 2013-12-22 MED ORDER — PANTOPRAZOLE SODIUM 40 MG PO TBEC
40.0000 mg | DELAYED_RELEASE_TABLET | Freq: Every day | ORAL | Status: DC
  Administered 2013-12-22 – 2013-12-25 (×3): 40 mg via ORAL
  Filled 2013-12-22 (×3): qty 1

## 2013-12-22 MED FILL — Potassium Chloride Inj 2 mEq/ML: INTRAVENOUS | Qty: 40 | Status: AC

## 2013-12-22 MED FILL — Heparin Sodium (Porcine) Inj 1000 Unit/ML: INTRAMUSCULAR | Qty: 30 | Status: AC

## 2013-12-22 MED FILL — Magnesium Sulfate Inj 50%: INTRAMUSCULAR | Qty: 10 | Status: AC

## 2013-12-22 NOTE — Progress Notes (Signed)
Patient ID: Robert Decker, male   DOB: 1950/12/06, 63 y.o.   MRN: [id number] TCTS DAILY ICU PROGRESS NOTE                   [address]            [place] [address]          [phone]   3 Days Post-Op Procedure(s) (LRB): CORONARY ARTERY BYPASS GRAFTING (CABG) x5 using right and left greater saphenous thigh vein and left internal mammary artery LIMA-LAD; SEQ SVG-OM1-OM2; SVG-PD; SVG-DIAG (N/A) INTRAOPERATIVE TRANSESOPHAGEAL ECHOCARDIOGRAM (N/A)  Total Length of Stay:  LOS: 6 days   Subjective: Feels well, no sob  Objective: Vital signs in last 24 hours: Temp:  [98.3 F (36.8 C)-98.7 F (37.1 C)] 98.3 F (36.8 C) (08/17 0700) Pulse Rate:  [71-102] 84 (08/17 0700) Cardiac Rhythm:  [-] Normal sinus rhythm (08/17 0600) Resp:  [9-33] 11 (08/17 0700) BP: (87-163)/(37-94) 133/83 mmHg (08/17 0600) SpO2:  [91 %-100 %] 95 % (08/17 0700) Weight:  [235 lb 8 oz (825.053 kg)] 235 lb 8 oz (106.822 kg) (08/17 0500)  Filed Weights   12/20/13 0600 12/21/13 0700 12/22/13 0500  Weight: 237 lb 10.5 oz (107.8 kg) 239 lb 13.8 oz (108.8 kg) 235 lb 8 oz (106.822 kg)    Weight change: -4 lb 5.8 oz (-1.978 kg)   Hemodynamic parameters for last 24 hours:    Intake/Output from previous day: 08/16 0701 - 08/17 0700 In: 878.7 [P.O.:840; I.V.:38.7] Out: 2520 [Urine:2520]  Intake/Output this shift:    Current Meds: Scheduled Meds: . antiseptic oral rinse  7 mL Mouth Rinse QID  . aspirin EC  325 mg Oral Daily  . atorvastatin  80 mg Oral q1800  . docusate sodium  200 mg Oral Daily  . enoxaparin (LOVENOX) injection  40 mg Subcutaneous QHS  . insulin aspart  0-24 Units Subcutaneous TID AC & HS  . insulin detemir  20 Units Subcutaneous Daily  . metoprolol tartrate  12.5 mg Oral BID  . moving right along book   Does not apply Once  . pantoprazole  40 mg Oral QAC breakfast  . sodium chloride  3 mL Intravenous Q12H   Continuous Infusions:  PRN Meds:.sodium chloride, bisacodyl,  bisacodyl, guaiFENesin, ondansetron (ZOFRAN) IV, ondansetron, oxyCODONE, sodium chloride, traMADol  General appearance: alert, cooperative, appears stated age and no distress Neurologic: intact Heart: regular rate and rhythm, S1, S2 normal, no murmur, click, rub or gallop Lungs: clear to auscultation bilaterally Abdomen: soft, non-tender; bowel sounds normal; no masses,  no organomegaly Extremities: extremities normal, atraumatic, no cyanosis or edema and Homans sign is negative, no sign of DVT Wound: sternum stable  Lab Results: CBC: Recent Labs  12/21/13 0400 12/22/13 0305  WBC 17.1* 10.8*  HGB 12.2* 11.1*  HCT 35.5* 33.1*  PLT 126* 120*   BMET:  Recent Labs  12/21/13 0400 12/22/13 0305  NA 135* 136*  K 4.3 4.1  CL 99 100  CO2 25 30  GLUCOSE 133* 113*  BUN 21 17  CREATININE 1.04 0.96  CALCIUM 8.5 8.3*    PT/INR:  Recent Labs  12/19/13 1500  LABPROT 17.7*  INR 1.46   Radiology: Dg Chest Port 1 View  12/22/2013   CLINICAL DATA:  Coronary artery disease  EXAM: PORTABLE CHEST - 1 VIEW  COMPARISON:  December 21, 2013  FINDINGS: There has been partial clearing of bibasilar atelectatic change compared to 1 day prior. Mild patchy bibasilar atelectasis  remains. No new opacity. Heart is enlarged with pulmonary vascularity. Patient is status post coronary artery bypass grafting. No pneumothorax. No adenopathy. Cordis is seen in the right neck region with the tip in the jugular vein region on the right.  IMPRESSION: Partial but incomplete clearing of bibasilar atelectatic change. No new opacity. Stable cardiac enlargement. Cordis tip in right jugular vein region. No pneumothorax.   Electronically Signed   By: Lowella Grip M.D.   On: 12/22/2013 07:14   Dg Chest Port 1 View  12/21/2013   CLINICAL DATA:  Postop from cardiac surgery  EXAM: PORTABLE CHEST - 1 VIEW  COMPARISON:  Yesterday  FINDINGS: Swan-Ganz removed with the introducer left in place. Low lung volumes and bilateral  scattered atelectasis persist with some improvement at the left base. Bilateral small pleural effusions persist. Left chest tube has been removed without ensuing pneumothorax. No sign of pulmonary edema. Normal heart size and normal-appearing mediastinum.  IMPRESSION: Chest tube removed without pneumothorax.  No pneumothorax or CHF  Scattered atelectasis with some improvement at the left base.   Electronically Signed   By: Maryclare Bean M.D.   On: 12/21/2013 08:05     Assessment/Plan: S/P Procedure(s) (LRB): CORONARY ARTERY BYPASS GRAFTING (CABG) x5 using right and left greater saphenous thigh vein and left internal mammary artery LIMA-LAD; SEQ SVG-OM1-OM2; SVG-PD; SVG-DIAG (N/A) INTRAOPERATIVE TRANSESOPHAGEAL ECHOCARDIOGRAM (N/A) Mobilize Diuresis Plan for transfer to step-down: see transfer orders     Jerret Mcbane B 12/22/2013 7:51 AM

## 2013-12-22 NOTE — Progress Notes (Signed)
  Amiodarone Drug - Drug Interaction Consult Note  Recommendations: Monitor QTc while on Amiodarone. Maintain K >4 and Magnesium >2.  Amiodarone is metabolized by the cytochrome P450 system and therefore has the potential to cause many drug interactions. Amiodarone has an average plasma half-life of 50 days (range 20 to 100 days).   There is potential for drug interactions to occur several weeks or months after stopping treatment and the onset of drug interactions may be slow after initiating amiodarone.   []  Statins: Increased risk of myopathy. Simvastatin- restrict dose to 20mg  daily. Other statins: counsel patients to report any muscle pain or weakness immediately.  []  Anticoagulants: Amiodarone can increase anticoagulant effect. Consider warfarin dose reduction. Patients should be monitored closely and the dose of anticoagulant altered accordingly, remembering that amiodarone levels take several weeks to stabilize.  []  Antiepileptics: Amiodarone can increase plasma concentration of phenytoin, the dose should be reduced. Note that small changes in phenytoin dose can result in large changes in levels. Monitor patient and counsel on signs of toxicity.  [x]  Beta blockers: increased risk of bradycardia, AV block and myocardial depression. Sotalol - avoid concomitant use.  []   Calcium channel blockers (diltiazem and verapamil): increased risk of bradycardia, AV block and myocardial depression.  []   Cyclosporine: Amiodarone increases levels of cyclosporine. Reduced dose of cyclosporine is recommended.  []  Digoxin dose should be halved when amiodarone is started.  []  Diuretics: increased risk of cardiotoxicity if hypokalemia occurs.  []  Oral hypoglycemic agents (glyburide, glipizide, glimepiride): increased risk of hypoglycemia. Patient's glucose levels should be monitored closely when initiating amiodarone therapy.   [x]  Drugs that prolong the QT interval:  Torsades de pointes risk may be  increased with concurrent use - avoid if possible.  Monitor QTc, also keep magnesium/potassium WNL if concurrent therapy can't be avoided. Marland Kitchen Antibiotics: e.g. fluoroquinolones, erythromycin. . Antiarrhythmics: e.g. quinidine, procainamide, disopyramide, sotalol. . Antipsychotics: e.g. phenothiazines, haloperidol.  . Lithium, tricyclic antidepressants, and methadone.  Zofran  Thank Dennis Bast  Norva Riffle  12/22/2013 11:33 AM

## 2013-12-22 NOTE — Progress Notes (Signed)
Patient ID: Robert Decker, male   DOB: 10/25/1950, 63 y.o.   MRN: [id number]  SICU Evening Rounds:  Hemodynamically stable. Sinus 80's  Was going to transfer out earlier but went into A-fib with RVR. Started on amio and converted.  No problems tonight.

## 2013-12-22 NOTE — Progress Notes (Signed)
Set-up and assist Pt with bath. Oral supplies given to Pt.

## 2013-12-22 NOTE — Progress Notes (Signed)
Subjective:  Palpitations. Patient date of ablation with rapid ventricular response this morning. Was started on IV amiodarone, this afternoon he converted back to sinus rhythm. Presently feels well except has noticed mild dyspnea and wheezing especially in the evening. He took 2 breathing treatments last evening. Otherwise feels well.  Objective:  Vital Signs in the last 24 hours: Temp:  [98.3 F (36.8 C)-98.7 F (37.1 C)] 98.7 F (37.1 C) (08/17 1150) Pulse Rate:  [71-111] 84 (08/17 1400) Resp:  [9-31] 16 (08/17 1400) BP: (87-163)/(37-94) 137/67 mmHg (08/17 1400) SpO2:  [90 %-100 %] 98 % (08/17 1400) Weight:  [106.822 kg (235 lb 8 oz)] 106.822 kg (235 lb 8 oz) (08/17 0500)  Intake/Output from previous day: 08/16 0701 - 08/17 0700 In: 878.7 [P.O.:840; I.V.:38.7] Out: 2520 [Urine:2520]  Physical Exam:   General appearance: alert, cooperative, appears stated age and no distress Eyes: negative findings: lids and lashes normal Neck: no carotid bruit and supple, symmetrical, trachea midline Neck: JVP - normal, carotids 2+= without bruits Resp: wheezes bilaterally Chest wall: no tenderness Cardio: regular rate and rhythm, S1, S2 normal, no murmur, click, rub or gallop GI: soft, non-tender; bowel sounds normal; no masses,  no organomegaly Extremities: edema 2 plus bilateral    Lab Results: BMP  Recent Labs  12/20/13 0350 12/20/13 1800 12/20/13 1845 12/21/13 0400 12/22/13 0305  NA 140  --  136* 135* 136*  K 4.5  --  4.2 4.3 4.1  CL 107  --  99 99 100  CO2 22  --   --  25 30  GLUCOSE 115*  --  140* 133* 113*  BUN 17  --  21 21 17   CREATININE 0.96 1.14 1.20 1.04 0.96  CALCIUM 7.6*  --   --  8.5 8.3*  GFRNONAA 87* 67*  --  75* 87*  GFRAA >90 78*  --  87* >90    CBC  Recent Labs Lab 12/22/13 0305  WBC 10.8*  RBC 3.60*  HGB 11.1*  HCT 33.1*  PLT 120*  MCV 91.9  MCH 30.8  MCHC 33.5  RDW 13.1    HEMOGLOBIN A1C Lab Results  Component Value Date   HGBA1C  5.4 12/17/2013   MPG 108 12/17/2013    Cardiac Panel (last 3 results) No results found for this basename: CKTOTAL, CKMB, TROPONINI, RELINDX,  in the last 8760 hours  BNP (last 3 results) No results found for this basename: PROBNP,  in the last 8760 hours  TSH No results found for this basename: TSH,  in the last 8760 hours  CHOLESTEROL No results found for this basename: CHOL,  in the last 8760 hours  Hepatic Function Panel  Recent Labs  12/17/13 1811  PROT 7.4  ALBUMIN 3.9  AST 25  ALT 31  ALKPHOS 100  BILITOT 1.8*    Imaging: Imaging results have been reviewed  Cardiac Studies:  EKG: 12/20/2013: Normal sinus rhythm, 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 One beat of atrial asystole. Telemetry: Atrial fibrillation with rapid response, converted to sinus rhythm 12/22/2013.  Scheduled Meds: . amiodarone  200 mg Oral BID  . antiseptic oral rinse  7 mL Mouth Rinse QID  . aspirin EC  325 mg Oral Daily  . atorvastatin  80 mg Oral q1800  . docusate sodium  200 mg Oral Daily  . enoxaparin (LOVENOX) injection  40 mg Subcutaneous QHS  . insulin aspart  0-24 Units Subcutaneous TID AC & HS  . insulin detemir  20 Units Subcutaneous Daily  .  metoprolol tartrate  12.5 mg Oral BID  . pantoprazole  40 mg Oral QAC breakfast  . sodium chloride  3 mL Intravenous Q12H   Continuous Infusions: . amiodarone     PRN Meds:.sodium chloride, bisacodyl, bisacodyl, guaiFENesin, ondansetron (ZOFRAN) IV, ondansetron, oxyCODONE, sodium chloride, traMADol  Assessment/Plan:  1. Post CABG atrial fibrillation with rapid response. 2. CAD status post CABG, 6 vessel bypass with 5 grafts including LIMA to LAD, SVG to OM1 and OM 2, SVG to PDA, SVG to D1 by Dr. Servando Snare on 12/19/2013  Recommendation: Continue by mouth amiodarone. I will increase his metoprolol to 25 mg by mouth twice a day. We'll continue to follow.  Laverda Page, M.D. 12/22/2013, 7:06 PM [facility] [place] Pager: [phone] Office:  [phone] If no answer: [phone]

## 2013-12-22 NOTE — Progress Notes (Signed)
During ambulation patient went into Afib w/ RVR rates ranging from 130's-170's; Dr. Servando Snare paged - received telephone orders for amiodarone protocol and to consult Dr. Einar Gip from cardiology.  Orders followed, and amiodarone initiated.

## 2013-12-23 ENCOUNTER — Inpatient Hospital Stay (HOSPITAL_COMMUNITY): Payer: 59

## 2013-12-23 LAB — GLUCOSE, CAPILLARY
GLUCOSE-CAPILLARY: 95 mg/dL (ref 70–99)
Glucose-Capillary: 109 mg/dL — ABNORMAL HIGH (ref 70–99)
Glucose-Capillary: 125 mg/dL — ABNORMAL HIGH (ref 70–99)
Glucose-Capillary: 113 mg/dL — ABNORMAL HIGH (ref 70–99)

## 2013-12-23 LAB — CBC
HCT: 31.2 % — ABNORMAL LOW (ref 39.0–52.0)
Hemoglobin: 11 g/dL — ABNORMAL LOW (ref 13.0–17.0)
MCH: 32.2 pg (ref 26.0–34.0)
MCHC: 35.3 g/dL (ref 30.0–36.0)
MCV: 91.2 fL (ref 78.0–100.0)
Platelets: 151 10*3/uL (ref 150–400)
RBC: 3.42 MIL/uL — ABNORMAL LOW (ref 4.22–5.81)
RDW: 13.2 % (ref 11.5–15.5)
WBC: 9.1 10*3/uL (ref 4.0–10.5)

## 2013-12-23 LAB — BASIC METABOLIC PANEL
Anion gap: 9 (ref 5–15)
BUN: 11 mg/dL (ref 6–23)
CO2: 28 mEq/L (ref 19–32)
Calcium: 8 mg/dL — ABNORMAL LOW (ref 8.4–10.5)
Chloride: 101 mEq/L (ref 96–112)
Creatinine, Ser: 0.76 mg/dL (ref 0.50–1.35)
GFR calc Af Amer: >90 mL/min (ref >90)
GFR calc non Af Amer: >90 mL/min (ref >90)
Glucose, Bld: 113 mg/dL — ABNORMAL HIGH (ref 70–99)
Potassium: 3.6 mEq/L — ABNORMAL LOW (ref 3.7–5.3)
Sodium: 138 mEq/L (ref 137–147)

## 2013-12-23 MED ORDER — POTASSIUM CHLORIDE CRYS ER 20 MEQ PO TBCR
20.0000 meq | EXTENDED_RELEASE_TABLET | ORAL | Status: AC
  Administered 2013-12-23 (×3): 20 meq via ORAL
  Filled 2013-12-23 (×3): qty 1

## 2013-12-23 NOTE — Op Note (Signed)
NAME:  Robert Decker, Robert Decker NO.:  [id number]  MEDICAL RECORD NO.:  [id number]  LOCATION:  2W22C                        FACILITY:  [facility]  PHYSICIAN:  Lanelle Bal, MD    DATE OF BIRTH:  Jun 05, 1950  DATE OF PROCEDURE:  12/19/2013 DATE OF DISCHARGE:                              OPERATIVE REPORT   PREOPERATIVE DIAGNOSES:  Coronary occlusive disease and unstable angina.  POSTOPERATIVE DIAGNOSES:  Coronary occlusive disease and unstable angina.  SURGICAL PROCEDURE:  Coronary artery bypass grafting x5 with left internal mammary to the left anterior descending coronary artery sequential reverse saphenous vein graft to the 1st and 2nd obtuse marginal, reverse saphenous vein graft to the posterior descending coronary artery with endo vein harvesting from bilateral thighs.  SURGEON:  Lanelle Bal, MD  ASSISTANT:  Renner Giovanni, PA-C.  BRIEF HISTORY:  The patient is a 63 year old male with no previous history of cardiac disease who presents with new onset of angina.  He was referred by Dr. Shelia Media to Dr. Kela Millin who proceeded with cardiac evaluation including cardiac catheterization.  At the time of cardiac catheterization, the patient was found to have high-grade complex proximal LAD disease.  In addition, he had diffuse disease throughout the circ system with multiple branches and subtotal distal right coronary artery.  Because of the patient's severe 3-vessel coronary artery disease and symptoms and positive stress test, coronary artery bypass grafting was recommended to the patient who agreed and signed informed consent.  DESCRIPTION OF PROCEDURE:  With Swan-Ganz and arterial line monitors in place, the patient underwent general endotracheal anesthesia without incidence.  Skin of chest and legs were prepped with Betadine and draped in usual sterile manner.  Appropriate time-out procedure was performed. We then proceeded with endo vein harvesting from the  right thigh using the Guidant system.  The right thigh was harvested without difficulty, but just below the knee the vein trifurcated and became small. Additional vein was then harvested from the left thigh, this vein was of good quality and caliber.  Median sternotomy was performed.  Left internal mammary artery was dissected down as a pedicle graft.  The distal artery was divided, had good free flow.  Pericardium was opened, overall ventricular function appeared preserved.  The patient was systemically heparinized.  Ascending aorta was cannulated, the right atrium was cannulated, and aortic root vent cardioplegia needle was introduced into the ascending aorta.  The patient was placed on cardiopulmonary bypass 2.4 L/min/m2.  Sites of anastomosis were selected and dissected out of the epicardium.  The patient's body temperature was cooled to 32 degrees.  Aortic crossclamp was applied and 500 mL of cold blood potassium cardioplegia was administered with diastolic arrest of heart.  Myocardial septal temperatures were monitored throughout the crossclamp.  Initially turned to the posterior descending coronary artery, which was opened, it was a small vessel, but did admit a 1.5 mm probe using a running 7-0 Prolene.  Distal anastomosis was performed. Additional cold blood cardioplegia administered down the vein grafts. Heart was then elevated and 3 obtuse marginal vessels were identified; the first and second were of moderate size and the third obtuse marginal was very small and not  felt large enough to bypass.  We proceeded with sequential bypass to the first and second obtuse marginal.  The first obtuse marginal was opened, it was partially intramyocardial vessel, admitted a 1.5 mm probe distally.  Using a diamond-type, side-to-side anastomosis with a running 7-0 Prolene, distal anastomosis was performed.  Distal extent of the same vein was then carried to the much larger second obtuse  marginal, which was opened and had moderate disease, but admitted a 1.5 mm probe easily.  Using a running 7-0 Prolene, distal anastomosis was performed.  Additional cold blood cardioplegia was administered down the vein grafts.  Attention was then turned to the left anterior descending coronary artery between the mid and distal vessel, the LAD was opened, admitted a 1.5 mm probe distally. Using a running 8-0 Prolene, left internal mammary artery was anastomosed to the left anterior descending coronary artery.  With release of Edwards bulldog on the mammary artery, there was rise in myocardial septal temperature.  Bulldog was placed back on the mammary artery.  With crossclamp still in place, 2 punch aortotomies were performed and each of the two vein grafts anastomosed to the ascending aorta.  Air was evacuated from the grafts and aortic cross-clamp was removed with total cross-clamp time 104 minutes.  The patient returned to a sinus rhythm.  Sites of anastomosis were inspected and were free of bleeding.  He was then ventilated and weaned from cardiopulmonary bypass without difficulty.  He remained hemodynamically stable, he was decannulated in the usual fashion.  Protamine sulfate was administered. With operative field hemostatic, a left pleural tube and a Blake mediastinal drain were left in place.  Pericardium was loosely reapproximated.  Sternum was closed with #6 stainless steel wire. Fascia was closed with interrupted 0-Vicryl, running 3-0 Vicryl in the subcutaneous tissue, 3-0 subcuticular stitch in skin edges.  Dry dressings were applied.  Sponge and needle count was reported as correct at the completion of the procedure.  The patient tolerated the procedure without obvious complications.  He did not require any blood bank blood products during the operative procedure.  Sponge and needle count was reported as correct.  Total pump time was 136 minutes.     Lanelle Bal,  MD     EG/MEDQ  D:  12/23/2013  T:  12/23/2013  Job:  [id number]  cc:   Laverda Page, MD

## 2013-12-23 NOTE — Progress Notes (Signed)
Pt ambulated 550 feet; RA sats while ambulating 92-93%; pt back to room to chair; sats 92-93%; audible exp wheeze noted; pt denies any resp distress; no O2 needed at this time; no need for walker during ambulation; will cont. To monitor.

## 2013-12-23 NOTE — Progress Notes (Signed)
CARDIAC REHAB PHASE I   PRE:  Rate/Rhythm: 82 SR    BP: sitting 133/61    SaO2: 94-97 RA  MODE:  Ambulation: 700 ft   POST:  Rate/Rhythm: 98 SR    BP: sitting 172/76     SaO2: 97 RA  Pt moving independently. Able to stand and walk long distance. Has significant SOB/wheezing while walking. SaO2 maintained 95-97 RA. Pt c/o some congestion. Stopped to cough x2. Return to recliner. Can walk independently tonight. [id number]   Josephina Shih [place] CES, ACSM 12/23/2013 2:52 PM

## 2013-12-23 NOTE — Progress Notes (Signed)
Patient ID: Robert Decker, male   DOB: 1951-03-15, 63 y.o.   MRN: [id number] TCTS DAILY ICU PROGRESS NOTE                   [address]            [place] [address]          [phone]   4 Days Post-Op Procedure(s) (LRB): CORONARY ARTERY BYPASS GRAFTING (CABG) x5 using right and left greater saphenous thigh vein and left internal mammary artery LIMA-LAD; SEQ SVG-OM1-OM2; SVG-PD; SVG-DIAG (N/A) INTRAOPERATIVE TRANSESOPHAGEAL ECHOCARDIOGRAM (N/A)  Total Length of Stay:  LOS: 7 days   Subjective: Holding sinus, breathing better  Objective: Vital signs in last 24 hours: Temp:  [98.2 F (36.8 C)-98.7 F (37.1 C)] 98.2 F (36.8 C) (08/18 0403) Pulse Rate:  [75-111] 82 (08/18 0700) Cardiac Rhythm:  [-] Normal sinus rhythm (08/18 0700) Resp:  [11-31] 14 (08/18 0700) BP: (94-149)/(40-79) 124/59 mmHg (08/18 0700) SpO2:  [90 %-100 %] 95 % (08/18 0700) Weight:  [235 lb 6.4 oz (106.777 kg)] 235 lb 6.4 oz (106.777 kg) (08/18 0500)  Filed Weights   12/21/13 0700 12/22/13 0500 12/23/13 0500  Weight: 239 lb 13.8 oz (108.8 kg) 235 lb 8 oz (106.822 kg) 235 lb 6.4 oz (106.777 kg)    Weight change: -1.6 oz (-0.045 kg)   Hemodynamic parameters for last 24 hours:    Intake/Output from previous day: 08/17 0701 - 08/18 0700 In: 1120 [P.O.:1120] Out: 2100 [Urine:2100]  Intake/Output this shift:    Current Meds: Scheduled Meds: . amiodarone  200 mg Oral BID  . antiseptic oral rinse  7 mL Mouth Rinse QID  . aspirin EC  325 mg Oral Daily  . atorvastatin  80 mg Oral q1800  . docusate sodium  200 mg Oral Daily  . enoxaparin (LOVENOX) injection  40 mg Subcutaneous QHS  . insulin aspart  0-24 Units Subcutaneous TID AC & HS  . insulin detemir  20 Units Subcutaneous Daily  . metoprolol tartrate  25 mg Oral BID  . pantoprazole  40 mg Oral QAC breakfast  . potassium chloride  20 mEq Oral 6 times per day  . sodium chloride  3 mL Intravenous Q12H   Continuous Infusions: .  amiodarone     PRN Meds:.sodium chloride, bisacodyl, bisacodyl, guaiFENesin, levalbuterol, ondansetron (ZOFRAN) IV, ondansetron, oxyCODONE, sodium chloride, traMADol  General appearance: alert and cooperative Neurologic: intact Heart: regular rate and rhythm, S1, S2 normal, no murmur, click, rub or gallop Lungs: clear to auscultation bilaterally Abdomen: soft, non-tender; bowel sounds normal; no masses,  no organomegaly Extremities: extremities normal, atraumatic, no cyanosis or edema and Homans sign is negative, no sign of DVT Wound: sternum intact dressing in place  Lab Results: CBC: Recent Labs  12/22/13 0305 12/23/13 0256  WBC 10.8* 9.1  HGB 11.1* 11.0*  HCT 33.1* 31.2*  PLT 120* 151   BMET:  Recent Labs  12/22/13 0305 12/23/13 0256  NA 136* 138  K 4.1 3.6*  CL 100 101  CO2 30 28  GLUCOSE 113* 113*  BUN 17 11  CREATININE 0.96 0.76  CALCIUM 8.3* 8.0*    PT/INR: No results found for this basename: LABPROT, INR,  in the last 72 hours Radiology: Dg Chest Port 1 View  12/22/2013   CLINICAL DATA:  Coronary artery disease  EXAM: PORTABLE CHEST - 1 VIEW  COMPARISON:  December 21, 2013  FINDINGS: There has been partial clearing of bibasilar atelectatic change  compared to 1 day prior. Mild patchy bibasilar atelectasis remains. No new opacity. Heart is enlarged with pulmonary vascularity. Patient is status post coronary artery bypass grafting. No pneumothorax. No adenopathy. Cordis is seen in the right neck region with the tip in the jugular vein region on the right.  IMPRESSION: Partial but incomplete clearing of bibasilar atelectatic change. No new opacity. Stable cardiac enlargement. Cordis tip in right jugular vein region. No pneumothorax.   Electronically Signed   By: Lowella Grip M.D.   On: 12/22/2013 07:14     Assessment/Plan: S/P Procedure(s) (LRB): CORONARY ARTERY BYPASS GRAFTING (CABG) x5 using right and left greater saphenous thigh vein and left internal mammary  artery LIMA-LAD; SEQ SVG-OM1-OM2; SVG-PD; SVG-DIAG (N/A) INTRAOPERATIVE TRANSESOPHAGEAL ECHOCARDIOGRAM (N/A) Plan for transfer to step-down: see transfer orders     Robert Decker B 12/23/2013 7:48 AM

## 2013-12-23 NOTE — Progress Notes (Signed)
Pt transferred from 2s on 6L O2 via [place]; sats 98% on 6L; O2 weaned down; sats on 1L [place] 95%; O2 removed at this time; pt sats RA 94%; will leave on RA and cont to check sats; will cont. To monitor.

## 2013-12-23 NOTE — Progress Notes (Signed)
Pt states he has ambulated x4 today.  Pt ambulating independently in room to BR.  Will continue to monitor closely. Call bell within reach.

## 2013-12-24 ENCOUNTER — Encounter (HOSPITAL_COMMUNITY): Payer: Self-pay | Admitting: Cardiothoracic Surgery

## 2013-12-24 LAB — GLUCOSE, CAPILLARY
GLUCOSE-CAPILLARY: 117 mg/dL — AB (ref 70–99)
GLUCOSE-CAPILLARY: 120 mg/dL — AB (ref 70–99)

## 2013-12-24 LAB — TSH: TSH: 6.83 u[IU]/mL — AB (ref 0.350–4.500)

## 2013-12-24 MED ORDER — CLOPIDOGREL BISULFATE 75 MG PO TABS
75.0000 mg | ORAL_TABLET | Freq: Every day | ORAL | Status: DC
  Administered 2013-12-24 – 2013-12-25 (×2): 75 mg via ORAL
  Filled 2013-12-24 (×4): qty 1

## 2013-12-24 MED ORDER — AMIODARONE HCL 200 MG PO TABS
200.0000 mg | ORAL_TABLET | Freq: Once | ORAL | Status: AC
  Administered 2013-12-24: 200 mg via ORAL
  Filled 2013-12-24: qty 1

## 2013-12-24 MED ORDER — METOPROLOL TARTRATE 25 MG PO TABS
37.5000 mg | ORAL_TABLET | Freq: Two times a day (BID) | ORAL | Status: DC
  Administered 2013-12-24 – 2013-12-25 (×3): 37.5 mg via ORAL
  Filled 2013-12-24 (×4): qty 1

## 2013-12-24 MED ORDER — METOPROLOL TARTRATE 1 MG/ML IV SOLN
5.0000 mg | Freq: Once | INTRAVENOUS | Status: AC
  Administered 2013-12-24: 5 mg via INTRAVENOUS
  Filled 2013-12-24: qty 5

## 2013-12-24 MED ORDER — ALPRAZOLAM 0.25 MG PO TABS
0.2500 mg | ORAL_TABLET | Freq: Three times a day (TID) | ORAL | Status: DC | PRN
  Administered 2013-12-24: 0.25 mg via ORAL
  Filled 2013-12-24: qty 1

## 2013-12-24 MED ORDER — AMIODARONE HCL 200 MG PO TABS
400.0000 mg | ORAL_TABLET | Freq: Three times a day (TID) | ORAL | Status: DC
  Administered 2013-12-24 – 2013-12-25 (×3): 400 mg via ORAL
  Filled 2013-12-24 (×5): qty 2

## 2013-12-24 NOTE — Progress Notes (Addendum)
Subjective:   Patient states he is ready to go home. No chest pain or palpitations. Ambulating in hallway  Without discomfort  Objective:  Vital Signs in the last 24 hours: Temp:  [98.3 F (36.8 C)-99.6 F (37.6 C)] 99.6 F (37.6 C) (08/19 0604) Pulse Rate:  [84-100] 100 (08/19 1024) Resp:  [17-20] 17 (08/19 0604) BP: (117-154)/(60-76) 146/60 mmHg (08/19 1024) SpO2:  [92 %-97 %] 92 % (08/19 0604) Weight:  [106 kg (233 lb 11 oz)] 106 kg (233 lb 11 oz) (08/19 0252)  Intake/Output from previous day: 08/18 0701 - 08/19 0700 In: 720 [P.O.:720] Out: 1375 [Urine:1375]  Physical Exam:   General appearance: alert, cooperative, appears stated age and no distress Eyes: negative findings: lids and lashes normal Neck: no carotid bruit and supple, symmetrical, trachea midline Neck: JVP - normal, carotids 2+= without bruits Resp: wheezes bilaterally Chest wall: no tenderness Cardio: regular rate and rhythm, S1, S2 normal, no murmur, click, rub or gallop GI: soft, non-tender; bowel sounds normal; no masses,  no organomegaly Extremities: edema 1-2 plus bilateral and Homans sign is negative, no sign of DVT    Lab Results: BMP  Recent Labs  12/21/13 0400 12/22/13 0305 12/23/13 0256  NA 135* 136* 138  K 4.3 4.1 3.6*  CL 99 100 101  CO2 25 30 28   GLUCOSE 133* 113* 113*  BUN 21 17 11   CREATININE 1.04 0.96 0.76  CALCIUM 8.5 8.3* 8.0*  GFRNONAA 75* 87* >90  GFRAA 87* >90 >90    CBC  Recent Labs Lab 12/23/13 0256  WBC 9.1  RBC 3.42*  HGB 11.0*  HCT 31.2*  PLT 151  MCV 91.2  MCH 32.2  MCHC 35.3  RDW 13.2    HEMOGLOBIN A1C Lab Results  Component Value Date   HGBA1C 5.4 12/17/2013   MPG 108 12/17/2013    Cardiac Panel (last 3 results) No results found for this basename: CKTOTAL, CKMB, TROPONINI, RELINDX,  in the last 8760 hours  BNP (last 3 results) No results found for this basename: PROBNP,  in the last 8760 hours  TSH No results found for this basename: TSH,   in the last 8760 hours  CHOLESTEROL No results found for this basename: CHOL,  in the last 8760 hours  Hepatic Function Panel  Recent Labs  12/17/13 1811  PROT 7.4  ALBUMIN 3.9  AST 25  ALT 31  ALKPHOS 100  BILITOT 1.8*    Imaging: Imaging results have been reviewed  Cardiac Studies:  EKG: 12/20/2013: Normal sinus rhythm, 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 One beat of atrial asystole. Telemetry: Atrial fibrillation with rapid response, converted to sinus rhythm 12/22/2013. 12/24/2013: Tele NSR, PVCs, ventricular couplets, brief episodes of atrial tachycardia, 5 beats to 6 beats without recurrence of atrial fibrillation.  Scheduled Meds: . amiodarone  200 mg Oral BID  . antiseptic oral rinse  7 mL Mouth Rinse QID  . aspirin EC  325 mg Oral Daily  . atorvastatin  80 mg Oral q1800  . docusate sodium  200 mg Oral Daily  . enoxaparin (LOVENOX) injection  40 mg Subcutaneous QHS  . insulin aspart  0-24 Units Subcutaneous TID AC & HS  . metoprolol tartrate  37.5 mg Oral BID  . pantoprazole  40 mg Oral QAC breakfast  . sodium chloride  3 mL Intravenous Q12H   Continuous Infusions: . amiodarone Stopped (12/22/13 1800)   PRN Meds:.sodium chloride, bisacodyl, bisacodyl, guaiFENesin, levalbuterol, ondansetron (ZOFRAN) IV, ondansetron, oxyCODONE, sodium chloride, traMADol  Assessment/Plan:  1. Post CABG atrial fibrillation with rapid response, resolved without recurrence. 2. CAD status post CABG, 6 vessel bypass with 5 grafts including LIMA to LAD, SVG to OM1 and OM 2, SVG to PDA, SVG to D1 by Dr. Servando Snare on 12/19/2013. Patient motivated for increasing mobility.  Recommendation:  Patient is stable from cardiac standpoint. He has not had any recurrence of atrial fibrillation, he is tolerating amiodarone. If Okay with TCTS, would like to add Plavix. I will see him back in the office.   Laverda Page, M.D. 12/24/2013, 11:44 AM [facility] Pager: [phone] Office:  [phone] If no answer: [phone] \  Patient this afternoon went to fibrillation with rapid ventricular response. Increased amiodarone from 200 mg twice a day to 400 mg 3 times a day, patient immediately converted back to sinus rhythm, will continue to observe him today and potentially be discharged in the morning. Continue metoprolol tartrate from 37.5  mg by mouth twice a day.

## 2013-12-24 NOTE — Progress Notes (Signed)
Pt converted to AFIB WITH RVR RATE 13O-150'S. CALLED DR. GANJI  MADE HIM AWARE, ORDERS GIVEN TO GIVEN 5MG  OF IV LOPRESSOR, CHANGE AMIODARONE 200MG  TO AMIODARONE 400MG  TID. MONITORING WILL CONTINUE.

## 2013-12-24 NOTE — Progress Notes (Addendum)
[name]       [place] [address]             [phone]      5 Days Post-Op Procedure(s) (LRB): CORONARY ARTERY BYPASS GRAFTING (CABG) x5 using right and left greater saphenous thigh vein and left internal mammary artery LIMA-LAD; SEQ SVG-OM1-OM2; SVG-PD; SVG-DIAG (N/A) INTRAOPERATIVE TRANSESOPHAGEAL ECHOCARDIOGRAM (N/A) Subjective: Feels well, has had some PAF this am  Objective: Vital signs in last 24 hours: Temp:  [98.3 F (36.8 C)-99.6 F (37.6 C)] 99.6 F (37.6 C) (08/19 0604) Pulse Rate:  [84-93] 87 (08/19 0604) Cardiac Rhythm:  [-] Normal sinus rhythm (08/19 0759) Resp:  [17-20] 17 (08/19 0604) BP: (117-154)/(63-76) 134/76 mmHg (08/19 0604) SpO2:  [92 %-98 %] 92 % (08/19 0604) Weight:  [233 lb 11 oz (106 kg)] 233 lb 11 oz (106 kg) (08/19 0252)  Hemodynamic parameters for last 24 hours:    Intake/Output from previous day: 08/18 0701 - 08/19 0700 In: 720 [P.O.:720] Out: 1375 [Urine:1375] Intake/Output this shift:    General appearance: alert, cooperative and no distress Heart: regular rate and rhythm Lungs: clear to auscultation bilaterally Abdomen: benign Extremities: + edema Wound: incis healing well  Lab Results:  Recent Labs  12/22/13 0305 12/23/13 0256  WBC 10.8* 9.1  HGB 11.1* 11.0*  HCT 33.1* 31.2*  PLT 120* 151   BMET:  Recent Labs  12/22/13 0305 12/23/13 0256  NA 136* 138  K 4.1 3.6*  CL 100 101  CO2 30 28  GLUCOSE 113* 113*  BUN 17 11  CREATININE 0.96 0.76  CALCIUM 8.3* 8.0*    PT/INR: No results found for this basename: LABPROT, INR,  in the last 72 hours ABG    Component Value Date/Time   PHART 7.302* 12/19/2013 1933   HCO3 23.2 12/19/2013 1933   TCO2 22 12/20/2013 1845   ACIDBASEDEF 3.0* 12/19/2013 1933   O2SAT 95.0 12/19/2013 1933   CBG (last 3)   Recent Labs  12/23/13 1622 12/23/13 2059 12/24/13 0636  GLUCAP 125* 113* 117*    Meds Scheduled Meds: . amiodarone  200 mg Oral BID  .  antiseptic oral rinse  7 mL Mouth Rinse QID  . aspirin EC  325 mg Oral Daily  . atorvastatin  80 mg Oral q1800  . docusate sodium  200 mg Oral Daily  . enoxaparin (LOVENOX) injection  40 mg Subcutaneous QHS  . insulin aspart  0-24 Units Subcutaneous TID AC & HS  . insulin detemir  20 Units Subcutaneous Daily  . metoprolol tartrate  25 mg Oral BID  . pantoprazole  40 mg Oral QAC breakfast  . sodium chloride  3 mL Intravenous Q12H   Continuous Infusions: . amiodarone Stopped (12/22/13 1800)   PRN Meds:.sodium chloride, bisacodyl, bisacodyl, guaiFENesin, levalbuterol, ondansetron (ZOFRAN) IV, ondansetron, oxyCODONE, sodium chloride, traMADol  Xrays Dg Chest 2 View  12/23/2013   CLINICAL DATA:  Coronary artery disease  EXAM: CHEST  2 VIEW  COMPARISON:  December 22, 2013  FINDINGS: Cordis tip is again noted in the right jugular vein region. 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 No pneumothorax. There is mild bibasilar atelectatic change. Elsewhere lungs are clear. Heart is upper normal in size with pulmonary vascularity within normal limits. No adenopathy.  IMPRESSION: Mild bibasilar atelectatic change persists. No frank consolidation. No change in cardiac silhouette. No pneumothorax.   Electronically Signed   By: Lowella Grip M.D.   On: 12/23/2013  08:05    Assessment/Plan: S/P Procedure(s) (LRB): CORONARY ARTERY BYPASS GRAFTING (CABG) x5 using right and left greater saphenous thigh vein and left internal mammary artery LIMA-LAD; SEQ SVG-OM1-OM2; SVG-PD; SVG-DIAG (N/A) INTRAOPERATIVE TRANSESOPHAGEAL ECHOCARDIOGRAM (N/A)  1 PAF this am, - may need to increase amio dose- will incerease lopressor to 37.5 BID, check TSH 2 push pulm toilet/rehab 3 sugars ok, will d/c insulin and follow- no h/o DM 4 no new labs- repeat in am   LOS: 8 days    GOLD,[name] 12/24/2013  Now in sinus  But had some brief episodes of a fib several hours ago, beta blocker and Cordarone  increased. No d/c today and monitor rhythm will need to decide about anticoagulation. I have seen and examined Robert Decker and agree with the above assessment  and plan.  [name] MD [facility] [phone] Office [phone] 12/24/2013 1:57 PM

## 2013-12-24 NOTE — Progress Notes (Signed)
Subjective:  Notes from date 12/23/2013. Entered late. Palpitations. No further episodes of palpitations. Continues to have mild wheezing. Otherwise feels well.  Objective:  Vital Signs in the last 24 hours: Temp:  [98.3 F (36.8 C)-99.6 F (37.6 C)] 99.6 F (37.6 C) (08/19 0604) Pulse Rate:  [84-93] 87 (08/19 0604) Resp:  [17-20] 17 (08/19 0604) BP: (117-154)/(63-76) 134/76 mmHg (08/19 0604) SpO2:  [92 %-98 %] 92 % (08/19 0604) Weight:  [106 kg (233 lb 11 oz)] 106 kg (233 lb 11 oz) (08/19 0252)  Intake/Output from previous day: 08/18 0701 - 08/19 0700 In: 720 [P.O.:720] Out: 1375 [Urine:1375]  Physical Exam:   General appearance: alert, cooperative, appears stated age and no distress Eyes: negative findings: lids and lashes normal Neck: no carotid bruit and supple, symmetrical, trachea midline Neck: JVP - normal, carotids 2+= without bruits Resp: wheezes bilaterally Chest wall: no tenderness Cardio: regular rate and rhythm, S1, S2 normal, no murmur, click, rub or gallop GI: soft, non-tender; bowel sounds normal; no masses,  no organomegaly Extremities: edema 2 plus bilateral    Lab Results: BMP  Recent Labs  12/21/13 0400 12/22/13 0305 12/23/13 0256  NA 135* 136* 138  K 4.3 4.1 3.6*  CL 99 100 101  CO2 25 30 28   GLUCOSE 133* 113* 113*  BUN 21 17 11   CREATININE 1.04 0.96 0.76  CALCIUM 8.5 8.3* 8.0*  GFRNONAA 75* 87* >90  GFRAA 87* >90 >90    CBC  Recent Labs Lab 12/23/13 0256  WBC 9.1  RBC 3.42*  HGB 11.0*  HCT 31.2*  PLT 151  MCV 91.2  MCH 32.2  MCHC 35.3  RDW 13.2    HEMOGLOBIN A1C Lab Results  Component Value Date   HGBA1C 5.4 12/17/2013   MPG 108 12/17/2013    Cardiac Panel (last 3 results) No results found for this basename: CKTOTAL, CKMB, TROPONINI, RELINDX,  in the last 8760 hours  BNP (last 3 results) No results found for this basename: PROBNP,  in the last 8760 hours  TSH No results found for this basename: TSH,  in the last  8760 hours  CHOLESTEROL No results found for this basename: CHOL,  in the last 8760 hours  Hepatic Function Panel  Recent Labs  12/17/13 1811  PROT 7.4  ALBUMIN 3.9  AST 25  ALT 31  ALKPHOS 100  BILITOT 1.8*    Imaging: Imaging results have been reviewed  Cardiac Studies:  EKG: 12/20/2013: Normal sinus rhythm, 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 One beat of atrial asystole. Telemetry: Atrial fibrillation with rapid response, converted to sinus rhythm 12/22/2013. 12/23/2013: Tele NSR  Scheduled Meds: . amiodarone  200 mg Oral BID  . antiseptic oral rinse  7 mL Mouth Rinse QID  . aspirin EC  325 mg Oral Daily  . atorvastatin  80 mg Oral q1800  . docusate sodium  200 mg Oral Daily  . enoxaparin (LOVENOX) injection  40 mg Subcutaneous QHS  . insulin aspart  0-24 Units Subcutaneous TID AC & HS  . insulin detemir  20 Units Subcutaneous Daily  . metoprolol tartrate  25 mg Oral BID  . pantoprazole  40 mg Oral QAC breakfast  . sodium chloride  3 mL Intravenous Q12H   Continuous Infusions: . amiodarone Stopped (12/22/13 1800)   PRN Meds:.sodium chloride, bisacodyl, bisacodyl, guaiFENesin, levalbuterol, ondansetron (ZOFRAN) IV, ondansetron, oxyCODONE, sodium chloride, traMADol  Assessment/Plan:  1. Post CABG atrial fibrillation with rapid response, resolved without recurrence. 2. CAD status post CABG, 6 vessel  bypass with 5 grafts including LIMA to LAD, SVG to OM1 and OM 2, SVG to PDA, SVG to D1 by Dr. Servando Snare on 12/19/2013. Patient motivated for increasing mobility.  Recommendation: Continue by mouth amiodarone.Tolerating increased dose of Metoprolol. If Okay with TCTS, would like to add Plavix.   Laverda Page, M.D. 12/24/2013, 8:39 AM [facility] Pager: [phone] Office: [phone] If no answer: [phone]

## 2013-12-24 NOTE — Care Management Note (Signed)
    Page 1 of 1   12/24/2013     2:47:16 PM CARE MANAGEMENT NOTE 12/24/2013  Patient:  JAIVEON, SUPPES   Account Number:  [id number]  Date Initiated:  12/22/2013  Documentation initiated by:  Babette Relic  Subjective/Objective Assessment:   s/p CABG; lives with spouse    PCP  Deland Pretty     Action/Plan:   Anticipated DC Date:  12/24/2013   Anticipated DC Plan:  [place]  CM consult      Choice offered to / List presented to:             Status of service:  Completed, signed off Medicare Important Message given?   (If response is "NO", the following Medicare IM given date fields will be blank) Date Medicare IM given:   Medicare IM given by:   Date Additional Medicare IM given:   Additional Medicare IM given by:    Discharge Disposition:  HOME/SELF CARE  Per UR Regulation:  Reviewed for med. necessity/level of care/duration of stay  If discussed at [place] of Stay Meetings, dates discussed:    Comments:  contacts:    Nations,Margaret Spouse [phone]   12/24/13 Ellan Lambert, RN, BSN [phone] Pt for poss dc today; no dc needs identified.

## 2013-12-24 NOTE — Progress Notes (Signed)
CARDIAC REHAB PHASE I   PRE:  Rate/Rhythm: 89 SR  BP:  Supine:   Sitting: 140/60  Standing:    SaO2: 94 RA  MODE:  Ambulation: 890 ft   POST:  Rate/Rhythm: 106 ST with PAC's  BP:  Supine:   Sitting: 146/70  Standing:    SaO2: 97 RA 0955-1035 Pt tolerated ambulation well without c/o. He is DOE and some wheezing noted with walking. He denies any c/o with walking. RA sat after walk 97%. Pt back to recliner after walk with call light in reach. Encouraged use of IS and for pt and his wife to watch heart surgery discharge video today. We will continue to follow pt.  Rodney Langton RN 12/24/2013 10:32 AM

## 2013-12-24 NOTE — Progress Notes (Signed)
PT CONVERTED TO NSR MONITORING WILL CONTINUE. DR. Einar Gip AWARE.

## 2013-12-25 DIAGNOSIS — I4891 Unspecified atrial fibrillation: Secondary | ICD-10-CM

## 2013-12-25 LAB — TSH: TSH: 8.59 u[IU]/mL — AB (ref 0.350–4.500)

## 2013-12-25 LAB — BASIC METABOLIC PANEL
Anion gap: 13 (ref 5–15)
BUN: 13 mg/dL (ref 6–23)
CALCIUM: 8.4 mg/dL (ref 8.4–10.5)
CO2: 24 mEq/L (ref 19–32)
Chloride: 102 mEq/L (ref 96–112)
Creatinine, Ser: 0.83 mg/dL (ref 0.50–1.35)
GFR calc Af Amer: >90 mL/min (ref >90)
GFR calc non Af Amer: >90 mL/min (ref >90)
Glucose, Bld: 121 mg/dL — ABNORMAL HIGH (ref 70–99)
Potassium: 3.7 mEq/L (ref 3.7–5.3)
Sodium: 139 mEq/L (ref 137–147)

## 2013-12-25 LAB — CBC
HCT: 35.2 % — ABNORMAL LOW (ref 39.0–52.0)
Hemoglobin: 11.7 g/dL — ABNORMAL LOW (ref 13.0–17.0)
MCH: 31.1 pg (ref 26.0–34.0)
MCHC: 33.2 g/dL (ref 30.0–36.0)
MCV: 93.6 fL (ref 78.0–100.0)
PLATELETS: 202 10*3/uL (ref 150–400)
RBC: 3.76 MIL/uL — ABNORMAL LOW (ref 4.22–5.81)
RDW: 13.5 % (ref 11.5–15.5)
WBC: 7.4 10*3/uL (ref 4.0–10.5)

## 2013-12-25 LAB — T3, FREE: T3, Free: 2.5 pg/mL (ref 2.3–4.2)

## 2013-12-25 LAB — T4, FREE: Free T4: 1.07 ng/dL (ref 0.80–1.80)

## 2013-12-25 MED ORDER — LISINOPRIL 5 MG PO TABS: 5.0000 mg | ORAL_TABLET | Freq: Every day | ORAL | Status: DC

## 2013-12-25 MED ORDER — LISINOPRIL 5 MG PO TABS
5.0000 mg | ORAL_TABLET | Freq: Every day | ORAL | Status: DC
  Administered 2013-12-25: 5 mg via ORAL
  Filled 2013-12-25: qty 1

## 2013-12-25 MED ORDER — OXYCODONE HCL 5 MG PO TABS: 5.0000 mg | ORAL_TABLET | ORAL | Status: DC | PRN

## 2013-12-25 MED ORDER — METOPROLOL TARTRATE 12.5 MG HALF TABLET: 37.5000 mg | ORAL_TABLET | Freq: Two times a day (BID) | ORAL | Status: DC

## 2013-12-25 MED ORDER — AMIODARONE HCL 400 MG PO TABS: 400.0000 mg | ORAL_TABLET | Freq: Two times a day (BID) | ORAL | Status: DC

## 2013-12-25 MED ORDER — CLOPIDOGREL BISULFATE 75 MG PO TABS: 75.0000 mg | ORAL_TABLET | Freq: Every day | ORAL | Status: AC

## 2013-12-25 NOTE — Discharge Summary (Signed)
Physician Discharge Summary  Patient ID: Robert Decker MRN: [id number] DOB/AGE: [birth year] 63 y.o.  Admit date: 12/16/2013 Discharge date: 12/25/2013  Admission Diagnoses: Severe three-vessel coronary artery disease with unstable angina   History of Present Illness: The patient is a 63 year old white male with class III angina and abnormal nuclear medicine study admitted today for cardiac catheterization. The nuclear study showed inferior wall scar and peri-infarct ischemia with decreased EF at 45 %. He was found t ohave severe 3 vessel disease and we have been asked to consult for consideration of surgical revascularization. His sx have progressed over 6 weeks. Pain is primarily mid sternal and always exertional. He has mild SOB with exertion.     PMH-  1 hypertension  2 hyperlipidemia  3 class III angina  4 sleep apnea- + moderate by study, beingfitted for cpap device  5 prostate cancer with seed implant   PSH  1 vasectomy  2 right inguinal hernia  3 tonsils  History   Smoking status   .  Not on file   Smokeless tobacco   .  Not on file    History   Alcohol Use  No    History    Social History   .  Marital Status:  Married     Spouse Name:  N/A     Number of Children:  N/A   .  Years of Education:  N/A    Occupational History   .  Not on file.    Social History Main Topics   .  Smoking status:  Not on file   .  Smokeless tobacco:  Not on file   .  Alcohol Use:  No   .  Drug Use:  No   .  Sexual Activity:     Other Topics  Concern   .  Not on file    Social History Narrative   .  No narrative on file    No Known Allergies   Discharge Diagnoses:  Active Problems:   Coronary atherosclerosis of native coronary artery   S/P CABG x 5  post operative atrial fibrillation  Discharged Condition: good  Hospital Course: Following cardiac catheterization the patient was seen in cardiothoracic surgical consultation and recommended surgical  revascularization.  Consults: cardiology  Significant Diagnostic Studies: Cardiac catheterization Procedure performed:  Left heart catheterization including hemodynamic monitoring of the left ventricle, LV gram, selective right and left coronary arteriography. Right and left IMA angiogram.  Indication patient is a 63 year-old Caucasian male with history of hypertension, hyperlipidemia, who presents with class III angina pectoris. Patient has had non invasive testing which was markedly abnormal revealing inferior wall scar with significant amount of peri-infarct ischemia and also lateral wall ischemia with ejection fraction of 45%. He had rapid progression of symptoms of angina pectoris. Hence is brought to the cardiac catheterization lab to evaluate the coronary anatomy for definitive diagnosis of CAD.  Hemodynamic data:  Left ventricular pressure was 115/8 with LVEDP of 20 mm mercury. Aortic pressure was 109/72 with a mean of 90 mm mercury. There was no pressure gradient across the aortic valve  Left ventricle: Performed in the RAO projection revealed LVEF of 50-55% with very mild mid to distal inferior hypokinesis. There was no significant MR.  Right coronary artery: Dominant. The right coronary is diffusely diseased, proximal segment has a 40-50% stenosis followed by distal segment which is occluded chronically. Distal RCA supplied by collaterals from the left.  Left main coronary  artery is large mildly calcified with a diffuse 20% stenosis.  Circumflex coronary artery: A large vessel giving origin to a large obtuse marginal 1. The proximal circumflex shows a 20-30% stenosis, mild calcified, midsegment of the circumflex coronary artery is chronically occluded with bridging collaterals. Just after the reconstitution, there is a 82 branch which is the severe diffuse disease in the proximal segment, but a moderate size vessel distally measuring at least about 2.5 mm. From the proximal segment there is a  large OM1 with a high-grade 90% stenosis. The midsegment to distal segment of the circumflex coronary artery shows a long segment high-grade 80-85% stenosis. Distal circumflex at the bifurcation has high-grade 90% stenosis.  LAD: LAD shows mild to moderate amount of proximal calcification. In the proximal segment of the LAD, there is a 50% stenosis followed by a tandem 99% stenosis. 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 There is a large diagonal-1 and distal to the origin of the diagonal the LAD has a 60-70% stenosis.  LIMA and RIMA: The left and right subclavian artery and RIMA and LIMA are widely patent. Both LIMA and RIMA are tortuous in the proximal segment.  Impression: Severe triple vessel coronary artery disease involving high-grade proximal LAD which is subtotally occluded, mid circumflex CTO with bridging collaterals followed by mid to distal high-grade 80-85% stenosis. Towards the termination of the circumflex where it bifurcates, there is high-grade lesion about 90%. AV groove circumflex has high-grade 90% proximal long segment stenosis. Right coronary artery is occluded with contralateral collaterals from left-to-right.  Due to severe triple vessel coronary artery disease, symptoms rapidly progressing over the last few weeks, there is high risk for cardiac events including sudden cardiac death. Given this anatomy, I do not feel comfortable for patient to be discharged home, patient will be admitted for consideration for inpatient CABG.  Technique: Under sterile precautions using a 6 French right radial arterial access, a 6 French sheath was introduced into the right radial artery. A 5 [place] Tig 4 catheter was advanced into the ascending aorta selective right coronary artery and left coronary artery was cannulated and angiography was performed in multiple views. The catheter was pulled back Out of the body over exchange length J-wire. Same Catheter was used to perform LV gram which was  performed in RAO projection. I also performed left internal mammary with ligation with the same catheter and also the right internal mammary arteriogram with the same catheter, catheter pulled out of the body over J-Wire. NO immediate complications noted. Patient tolerated the procedure well. A total of 90 cc of contrast was utilized for diagnostic angiography.  Treatments: DATE OF PROCEDURE: 12/19/2013  DATE OF DISCHARGE:  OPERATIVE REPORT  PREOPERATIVE DIAGNOSES: Coronary occlusive disease and unstable angina.  POSTOPERATIVE DIAGNOSES: Coronary occlusive disease and unstable  angina.  SURGICAL PROCEDURE: Coronary artery bypass grafting x5 with left  internal mammary to the left anterior descending coronary artery  sequential reverse saphenous vein graft to the 1st and 2nd obtuse  marginal, reverse saphenous vein graft to the posterior descending  coronary artery with endo vein harvesting from bilateral thighs.  SURGEON: Lanelle Bal, MD  ASSISTANT: Azariah Giovanni, PA-C.   Postoperative hospital course:  Overall the patient has done well. He was weaned from the ventilator without difficulty using standard protocols. All routine lines, monitors and drainage devices have been discontinued in the standard fashion. He has had postoperative atrial fibrillation and has been chemically cardioverted to sinus rhythm with amiodarone and beta blocker.  He has a mild postoperative volume overload. He has a mild postoperative acute blood loss anemia. Incisions are healing well without evidence of infection. He is tolerating diet and routine rehabilitation. We will remove his pacer wires morning and if he remains in normal sinus rhythm and has no other difficulties, we will discharge him later on today's date.   Blood pressure 134/73, pulse 86, temperature 98.9 F (37.2 C), temperature source Oral, resp. rate 18, height 5\' 6"  (1.676 m), weight 232 lb 12.8 oz (105.597 kg), SpO2 92.00%. Discharge  Exam:  General appearance: alert, cooperative and no distress  Heart: regular rate and rhythm  Lungs: clear to auscultation bilaterally  Abdomen: benign  Extremities: + LE edema  Wound: incis healing well, mod echymosis LE's   Disposition: 01-Home or Self Care  medications at time of discharge: The patient has been discharged on:   Medication List    STOP taking these medications       amLODipine 2.5 MG tablet  Commonly known as:  NORVASC      TAKE these medications       amiodarone 400 MG tablet  Commonly known as:  PACERONE  Take 1 tablet (400 mg total) by mouth 2 (two) times daily. For 7 days, then take 400 mg once daily     aspirin EC 81 MG tablet  Take 81 mg by mouth daily.     atorvastatin 80 MG tablet  Commonly known as:  LIPITOR  Take 80 mg by mouth daily.     clopidogrel 75 MG tablet  Commonly known as:  PLAVIX  Take 1 tablet (75 mg total) by mouth daily with breakfast.     lisinopril 5 MG tablet  Commonly known as:  PRINIVIL,ZESTRIL  Take 1 tablet (5 mg total) by mouth daily.     metoprolol tartrate 12.5 mg Tabs tablet  Commonly known as:  LOPRESSOR  Take 1.5 tablets (37.5 mg total) by mouth 2 (two) times daily.     oxyCODONE 5 MG immediate release tablet  Commonly known as:  Oxy IR/ROXICODONE  Take 1-2 tablets (5-10 mg total) by mouth every 4 (four) hours as needed for severe pain.       Follow-up Information   Follow up with Laverda Page, MD On 01/14/2014. (10:45 AM.Bring all medications)    Specialty:  Cardiology   Contact information:   [address] [place] [address] [phone]       Follow up with [name], MD. (Appointment to see Dr. Servando Snare on September 10th at 11:15 AM. Please obtain a chest x-ray a [facility] at 10:45 AM.. [facility] is located in the same office complex.)    Specialty:  Cardiothoracic Surgery   Contact information:   [address] [place]  [address] [phone]       1.Beta Blocker:  Yes [ y  ]                              No   [   ]                              If No, reason:  2.Ace Inhibitor/ARB: Yes Blue.Reese   ]  No  [    ]                                     If No, reason:  3.Statin:   Yes [ y  ]                  No  [   ]                  If No, reason:  4.Ecasa:  Yes  [  y ]                  No   [   ]                  If No, reason:   Signed: GOLD,[name] 12/25/2013, 7:58 AM

## 2013-12-25 NOTE — Progress Notes (Signed)
Ed completed with wife present. Voiced understanding with good reception. Interested in [facility] and will send referral to [place]. [phone] Yves Dill CES, ACSM 11:29 AM 12/25/2013

## 2013-12-25 NOTE — Progress Notes (Addendum)
Pt discharged home with wife Discharge instructions given & reviewed Eduction discussed  IV dc'd  Tele dc'd  EKG done and labs taken per MD order  Sutures not removed per MD orders PO pain medication given prior to dc per patient request  Pt discharged via wheelchair

## 2013-12-25 NOTE — Progress Notes (Signed)
bilat pacing wires pulled, vss, pt tolerated well, resting in bed at present. Robert Decker 12/25/2013

## 2013-12-25 NOTE — Progress Notes (Signed)
Subjective:   Patient states he is ready to go home. No chest pain or palpitations. No further A. Fib episodes or no frequent PVC on tele Objective:  Vital Signs in the last 24 hours: Temp:  [98.4 F (36.9 C)-99.1 F (37.3 C)] 98.4 F (36.9 C) (08/20 0915) Pulse Rate:  [78-94] 86 (08/20 1015) Resp:  [18-20] 20 (08/20 1015) BP: (120-158)/(57-98) 133/72 mmHg (08/20 1015) SpO2:  [92 %-98 %] 94 % (08/20 1015) Weight:  [105.597 kg (232 lb 12.8 oz)] 105.597 kg (232 lb 12.8 oz) (08/20 0442)  Intake/Output from previous day: 08/19 0701 - 08/20 0700 In: 1210 [P.O.:1210] Out: 800 [Urine:800]  Physical Exam:   General appearance: alert, cooperative, appears stated age and no distress Eyes: negative findings: lids and lashes normal Neck: no carotid bruit and supple, symmetrical, trachea midline Neck: JVP - normal, carotids 2+= without bruits Resp: clear to auscultation bilaterally Chest wall: no tenderness Cardio: regular rate and rhythm, S1, S2 normal, no murmur, click, rub or gallop GI: soft, non-tender; bowel sounds normal; no masses,  no organomegaly Extremities: edema trace and Homans sign is negative, no sign of DVT    Lab Results: BMP  Recent Labs  12/22/13 0305 12/23/13 0256 12/25/13 0400  NA 136* 138 139  K 4.1 3.6* 3.7  CL 100 101 102  CO2 30 28 24   GLUCOSE 113* 113* 121*  BUN 17 11 13   CREATININE 0.96 0.76 0.83  CALCIUM 8.3* 8.0* 8.4  GFRNONAA 87* >90 >90  GFRAA >90 >90 >90    CBC  Recent Labs Lab 12/25/13 0400  WBC 7.4  RBC 3.76*  HGB 11.7*  HCT 35.2*  PLT 202  MCV 93.6  MCH 31.1  MCHC 33.2  RDW 13.5    HEMOGLOBIN A1C Lab Results  Component Value Date   HGBA1C 5.4 12/17/2013   MPG 108 12/17/2013    TSH  Recent Labs  12/24/13 1110  TSH 6.830*   Hepatic Function Panel  Recent Labs  12/17/13 1811  PROT 7.4  ALBUMIN 3.9  AST 25  ALT 31  ALKPHOS 100  BILITOT 1.8*    Imaging: Imaging results have been reviewed  Cardiac  Studies:  EKG: 12/20/2013: Normal sinus rhythm, 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 One beat of atrial asystole. Telemetry: Atrial fibrillation with rapid response, converted to sinus rhythm 12/22/2013. 12/24/2013: Tele NSR, PVCs, ventricular couplets, brief episodes of atrial tachycardia, 5 beats to 6 beats without recurrence of atrial fibrillation. EKG 12/25/2013: Normal sinus rhythm, left atrial abnormality, 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 No evidence of ischemia. Normal QT interval.  Scheduled Meds: . amiodarone  400 mg Oral TID  . aspirin EC  325 mg Oral Daily  . atorvastatin  80 mg Oral q1800  . clopidogrel  75 mg Oral Q breakfast  . docusate sodium  200 mg Oral Daily  . enoxaparin (LOVENOX) injection  40 mg Subcutaneous QHS  . lisinopril  5 mg Oral Daily  . metoprolol tartrate  37.5 mg Oral BID  . pantoprazole  40 mg Oral QAC breakfast  . sodium chloride  3 mL Intravenous Q12H   Continuous Infusions: . amiodarone Stopped (12/22/13 1800)   PRN Meds:.sodium chloride, ALPRAZolam, bisacodyl, bisacodyl, guaiFENesin, levalbuterol, ondansetron (ZOFRAN) IV, ondansetron, oxyCODONE, sodium chloride, traMADol  Assessment/Plan:  1. Post CABG atrial fibrillation with rapid response, resolved without recurrence. Patient presently maintain sinus rhythm on amiodarone 400 mg by mouth 3 times a day. 2. CAD status post CABG, 6 vessel bypass with 5 grafts including LIMA to LAD,  SVG to OM1 and OM 2, SVG to PDA, SVG to D1 by Dr. Servando Snare on 12/19/2013. Patient motivated for increasing mobility. 3. Hypertension 4. Hyperlipidemia 5. Abnormal TSH, needs follow up TSH, FT4 and T3.  Recommendation: Patient stable from cardiac standpoint. I have already started him on Plavix after I discussed with Dr. Lanelle Bal. Patient will follow up with me in the outpatient basis. I will order TSH, T3 and T4 prior to his discharge. Laverda Page, M.D. 12/25/2013, 10:49 AM [facility] Pager: [phone] Office:  [phone] If no answer: [phone]

## 2013-12-25 NOTE — Progress Notes (Addendum)
[address]       [place], [address]             [phone]      6 Days Post-Op Procedure(s) (LRB): CORONARY ARTERY BYPASS GRAFTING (CABG) x5 using right and left greater saphenous thigh vein and left internal mammary artery LIMA-LAD; SEQ SVG-OM1-OM2; SVG-PD; SVG-DIAG (N/A) INTRAOPERATIVE TRANSESOPHAGEAL ECHOCARDIOGRAM (N/A) Subjective: Feels ok, no new issues, maintaining Sinus Rhythm  Objective: Vital signs in last 24 hours: Temp:  [98.6 F (37 C)-99.1 F (37.3 C)] 98.9 F (37.2 C) (08/20 0442) Pulse Rate:  [78-100] 86 (08/20 0442) Cardiac Rhythm:  [-] Normal sinus rhythm (08/19 2045) Resp:  [18] 18 (08/20 0442) BP: (130-158)/(57-98) 134/73 mmHg (08/20 0442) SpO2:  [92 %-98 %] 92 % (08/20 0442) Weight:  [232 lb 12.8 oz (105.597 kg)] 232 lb 12.8 oz (105.597 kg) (08/20 0442)  Hemodynamic parameters for last 24 hours:    Intake/Output from previous day: 08/19 0701 - 08/20 0700 In: 1210 [P.O.:1210] Out: 800 [Urine:800] Intake/Output this shift:    General appearance: alert, cooperative and no distress Heart: regular rate and rhythm Lungs: clear to auscultation bilaterally Abdomen: benign Extremities: + LE edema Wound: incis healing well, mod echymosis LE's  Lab Results:  Recent Labs  12/23/13 0256 12/25/13 0400  WBC 9.1 7.4  HGB 11.0* 11.7*  HCT 31.2* 35.2*  PLT 151 202   BMET:  Recent Labs  12/23/13 0256 12/25/13 0400  NA 138 139  K 3.6* 3.7  CL 101 102  CO2 28 24  GLUCOSE 113* 121*  BUN 11 13  CREATININE 0.76 0.83  CALCIUM 8.0* 8.4    PT/INR: No results found for this basename: LABPROT, INR,  in the last 72 hours ABG    Component Value Date/Time   PHART 7.302* 12/19/2013 1933   HCO3 23.2 12/19/2013 1933   TCO2 22 12/20/2013 1845   ACIDBASEDEF 3.0* 12/19/2013 1933   O2SAT 95.0 12/19/2013 1933   CBG (last 3)   Recent Labs  12/23/13 2059 12/24/13 0636 12/24/13 1127  GLUCAP 113* 117* 120*    Meds Scheduled  Meds: . amiodarone  400 mg Oral TID  . aspirin EC  325 mg Oral Daily  . atorvastatin  80 mg Oral q1800  . clopidogrel  75 mg Oral Q breakfast  . docusate sodium  200 mg Oral Daily  . enoxaparin (LOVENOX) injection  40 mg Subcutaneous QHS  . metoprolol tartrate  37.5 mg Oral BID  . pantoprazole  40 mg Oral QAC breakfast  . sodium chloride  3 mL Intravenous Q12H   Continuous Infusions: . amiodarone Stopped (12/22/13 1800)   PRN Meds:.sodium chloride, ALPRAZolam, bisacodyl, bisacodyl, guaiFENesin, levalbuterol, ondansetron (ZOFRAN) IV, ondansetron, oxyCODONE, sodium chloride, traMADol  Xrays No results found.  Assessment/Plan: S/P Procedure(s) (LRB): CORONARY ARTERY BYPASS GRAFTING (CABG) x5 using right and left greater saphenous thigh vein and left internal mammary artery LIMA-LAD; SEQ SVG-OM1-OM2; SVG-PD; SVG-DIAG (N/A) INTRAOPERATIVE TRANSESOPHAGEAL ECHOCARDIOGRAM (N/A)  1 steady progress 2 rhythm stable- QTc 475 , will d/c epw's this am,  3 poss home later today 4 labs stable 5 ? Does he need ASA and plavix 6 will add low dose ace inhib. Lisinopril 5 mg daily for now  LOS: 9 days    GOLD,[name] 12/25/2013  Hold sinus since yesterday Home on asa 81 mg and plavix I have seen and examined Vidal Schwalbe and agree with the above assessment  and plan.  [name]  MD [facility] [phone] Office [phone] 12/25/2013 8:09 AM

## 2013-12-25 NOTE — Discharge Instructions (Signed)
Endoscopic Saphenous Vein Harvesting °Care After °Refer to this sheet in the next few weeks. These instructions provide you with information on caring for yourself after your procedure. Your health care provider may also give you more specific instructions. Your treatment has been planned according to current medical practices, but problems sometimes occur. Call your health care provider if you have any problems or questions after your procedure. °HOME CARE INSTRUCTIONS °Medicine °· Take whatever pain medicine your surgeon prescribes. Follow the directions carefully. Do not take over-the-counter pain medicine unless your surgeon says it is okay. Some pain medicine can cause bleeding problems for several weeks after surgery. °· Follow your surgeon's instructions about driving. You will probably not be permitted to drive after heart surgery. °· Take any medicines your surgeon prescribes. Any medicines you took before your heart surgery should be checked with your health care provider before you start taking them again. °Wound care °· If your surgeon has prescribed an elastic bandage or stocking, ask how long you should wear it. °· Check the area around your surgical cuts (incisions) whenever your bandages (dressings) are changed. Look for any redness or swelling. °· You will need to return to have the stitches (sutures) or staples taken out. Ask your surgeon when to do that. °· Ask your surgeon when you can shower or bathe. °Activity °· Try to keep your legs raised when you are sitting. °· Do any exercises your health care providers have given you. These may include deep breathing exercises, coughing, walking, or other exercises. °SEEK MEDICAL CARE IF: °· You have any questions about your medicines. °· You have more leg pain, especially if your pain medicine stops working. °· New or growing bruises develop on your leg. °· Your leg swells, feels tight, or becomes red. °· You have numbness in your leg. °SEEK IMMEDIATE  MEDICAL CARE IF: °· Your pain gets much worse. °· Blood or fluid leaks from any of the incisions. °· Your incisions become warm, swollen, or red. °· You have chest pain. °· You have trouble breathing. °· You have a fever. °· You have more pain near your leg incision. °MAKE SURE YOU: °· Understand these instructions. °· Will watch your condition. °· Will get help right away if you are not doing well or get worse. °Document Released: 01/04/2011 Document Revised: 04/29/2013 Document Reviewed: 01/04/2011 °ExitCare® Patient Information ©2015 [employer]. This information is not intended to replace advice given to you by your health care provider. Make sure you discuss any questions you have with your health care provider. °Coronary Artery Bypass Grafting, Care After °These instructions give you information on caring for yourself after your procedure. Your doctor may also give you more specific instructions. Call your doctor if you have any problems or questions after your procedure.  °HOME CARE °· Only take medicine as told by your doctor. Take medicines exactly as told. Do not stop taking medicines or start any new medicines without talking to your doctor first. °· Take your pulse as told by your doctor. °· Do deep breathing as told by your doctor. Use your breathing device (incentive spirometer), if given, to practice deep breathing several times a day. Support your chest with a pillow or your arms when you take deep breaths or cough. °· Keep the area clean, dry, and protected where the surgery cuts (incisions) were made. Remove bandages (dressings) only as told by your doctor. If strips were applied to surgical area, do not take them off. They fall off   on their own. °· Check the surgery area daily for puffiness (swelling), redness, or leaking fluid. °· If surgery cuts were made in your legs: °· Avoid crossing your legs. °· Avoid sitting for long periods of time. Change positions every 30 minutes. °· Raise your legs  when you are sitting. Place them on pillows. °· Wear stockings that help keep blood clots from forming in your legs (compression stockings). °· Only take sponge baths until your doctor says it is okay to take showers. Pat the surgery area dry. Do not rub the surgery area with a washcloth or towel. Do not bathe, swim, or use a hot tub until your doctor says it is okay. °· Eat foods that are high in fiber. These include raw fruits and vegetables, whole grains, beans, and nuts. Choose lean meats. Avoid canned, processed, and fried foods. °· Drink enough fluids to keep your pee (urine) clear or pale yellow. °· Weigh yourself every day. °· Rest and limit activity as told by your doctor. You may be told to: °· Stop any activity if you have chest pain, shortness of breath, changes in heartbeat, or dizziness. Get help right away if this happens. °· Move around often for short amounts of time or take short walks as told by your doctor. Gradually become more active. You may need help to strengthen your muscles and build endurance. °· Avoid lifting, pushing, or pulling anything heavier than 10 pounds (4.5 kg) for at least 6 weeks after surgery. °· Do not drive until your doctor says it is okay. °· Ask your doctor when you can go back to work. °· Ask your doctor when you can begin sexual activity again. °· Follow up with your doctor as told. °GET HELP IF: °· You have puffiness, redness, more pain, or fluid draining from the incision site. °· You have a fever. °· You have puffiness in your ankles or legs. °· You have pain in your legs. °· You gain 2 or more pounds (0.9 kg) a day. °· You feel sick to your stomach (nauseous) or throw up (vomit). °· You have watery poop (diarrhea). °GET HELP RIGHT AWAY IF: °· You have chest pain that goes to your jaw or arms. °· You have shortness of breath. °· You have a fast or irregular heartbeat. °· You notice a "clicking" in your breastbone when you move. °· You have numbness or weakness in  your arms or legs. °· You feel dizzy or light-headed. °MAKE SURE YOU: °· Understand these instructions. °· Will watch your condition. °· Will get help right away if you are not doing well or get worse. °Document Released: 04/29/2013 Document Reviewed: 04/29/2013 °ExitCare® Patient Information ©2015 [employer]. This information is not intended to replace advice given to you by your health care provider. Make sure you discuss any questions you have with your health care provider. ° °

## 2013-12-25 NOTE — Progress Notes (Signed)
Per Dr. Jadene Pierini okay to leave pt's sutures in and will set up OP appointment to have them removed.

## 2013-12-29 ENCOUNTER — Encounter: Payer: Self-pay | Admitting: Cardiothoracic Surgery

## 2013-12-29 DIAGNOSIS — R7989 Other specified abnormal findings of blood chemistry: Secondary | ICD-10-CM | POA: Insufficient documentation

## 2013-12-31 ENCOUNTER — Ambulatory Visit (INDEPENDENT_AMBULATORY_CARE_PROVIDER_SITE_OTHER): Payer: Self-pay | Admitting: *Deleted

## 2013-12-31 ENCOUNTER — Other Ambulatory Visit: Payer: Self-pay | Admitting: Cardiothoracic Surgery

## 2013-12-31 DIAGNOSIS — I2510 Atherosclerotic heart disease of native coronary artery without angina pectoris: Secondary | ICD-10-CM

## 2013-12-31 DIAGNOSIS — J869 Pyothorax without fistula: Secondary | ICD-10-CM

## 2013-12-31 DIAGNOSIS — Z951 Presence of aortocoronary bypass graft: Secondary | ICD-10-CM

## 2013-12-31 DIAGNOSIS — I25119 Atherosclerotic heart disease of native coronary artery with unspecified angina pectoris: Secondary | ICD-10-CM

## 2014-01-02 NOTE — Progress Notes (Signed)
Mr. Ferrufino returns on 12/30/13 for suture removal previous chest tube sutures s/p CABG. He continues to have some drainage from his right evh incision. This is clear/pink.  He has residual bruising of both his lower extremities with swelling.  There is no redness.  I encouraged continued elevation.  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  I suggested he return to see the P.A. On Monday. All his other operative incisions are very well healed.

## 2014-01-05 ENCOUNTER — Ambulatory Visit
Admission: RE | Admit: 2014-01-05 | Discharge: 2014-01-05 | Disposition: A | Discharge status: Home / Self Care | Payer: 59 | Source: Ambulatory Visit | Attending: Cardiothoracic Surgery | Admitting: Cardiothoracic Surgery

## 2014-01-05 ENCOUNTER — Ambulatory Visit (INDEPENDENT_AMBULATORY_CARE_PROVIDER_SITE_OTHER): Payer: Self-pay | Admitting: Physician Assistant

## 2014-01-05 VITALS — BP 147/80 | HR 66 | Resp 20 | Ht 66.0 in | Wt 232.0 lb

## 2014-01-05 DIAGNOSIS — Z951 Presence of aortocoronary bypass graft: Secondary | ICD-10-CM

## 2014-01-05 DIAGNOSIS — I25119 Atherosclerotic heart disease of native coronary artery with unspecified angina pectoris: Secondary | ICD-10-CM

## 2014-01-05 DIAGNOSIS — I209 Angina pectoris, unspecified: Secondary | ICD-10-CM

## 2014-01-05 DIAGNOSIS — J869 Pyothorax without fistula: Secondary | ICD-10-CM

## 2014-01-05 DIAGNOSIS — Z0279 Encounter for issue of other medical certificate: Secondary | ICD-10-CM

## 2014-01-05 DIAGNOSIS — I2510 Atherosclerotic heart disease of native coronary artery without angina pectoris: Secondary | ICD-10-CM

## 2014-01-05 NOTE — Progress Notes (Signed)
HPI: Patient returns for routine postoperative follow-up having undergone a CABG x 5 on 12/19/2013 by Dr. Servando Snare. The patient's early postoperative recovery while in the hospital was notable for atrial fibrillation. He was put on Amiodarone.Since hospital discharge the patient reports he has some swelling of his lower extremities . He has not taken any narcotics in 10 days and is walking daily. 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   Current Outpatient Prescriptions  Medication Sig Dispense Refill  . amiodarone (PACERONE) 400 MG tablet Take 1 tablet (400 mg total) by mouth 2 (two) times daily. For 7 days, then take 400 mg once daily  70 tablet  1  . aspirin EC 81 MG tablet Take 81 mg by mouth daily.      Marland Kitchen atorvastatin (LIPITOR) 80 MG tablet Take 80 mg by mouth daily.      . clopidogrel (PLAVIX) 75 MG tablet Take 1 tablet (75 mg total) by mouth daily with breakfast.  30 tablet  1  . lisinopril (PRINIVIL,ZESTRIL) 5 MG tablet Take 1 tablet (5 mg total) by mouth daily.  30 tablet  1  . metoprolol tartrate (LOPRESSOR) 12.5 mg TABS tablet Take 1.5 tablets (37.5 mg total) by mouth 2 (two) times daily.  90 each  1  . oxyCODONE (OXY IR/ROXICODONE) 5 MG immediate release tablet Take 1-2 tablets (5-10 mg total) by mouth every 4 (four) hours as needed for severe pain.  50 tablet  0   No current facility-administered medications for this visit.    Physical Exam: Cardiovascular: RRR Pulmonary:Clear to auscultation bilaterally Abdomen: Soft, non tender, bowel sounds present Extremities: Bilateral lower extremity edema Wounds: Sternal wound is clean, dry, no signs of infection. RLE wound is clean and dry. Slight superficial pinkness around skin edges but no cellulitis. No drainage.   Diagnostic Tests: CLINICAL DATA: Atrial fibrillation. Postop CABG 2 weeks ago. Mild  cough.  EXAM:  CHEST 2 VIEW  COMPARISON: 12/23/2013.  FINDINGS:  1431 hr. The heart size and mediastinal contours are  stable status  post CABG. There is improved aeration of both lung bases. There is  no significant pleural effusion. The osseous structures appear  unchanged.  IMPRESSION:  Resolved bibasilar atelectasis and small pleural effusions. No acute  cardiopulmonary process.  Electronically Signed  By: Camie Patience M.D.  On: 01/05/2014 14:44   Impression and Plan: Overall, Robert Decker is recovering well from coronary artery bypass grafting surgery. Regarding his medications, I instructed him to decrease the Amiodarone to 200 orally daily as he continues to maintain sinus rhythm and heart rate is in the mid 60's. I gave him a prescription for Lasix 40 daily and potassium supplement 20 meq daily for one week. He was also instructed to weigh himself daily. He was instructed he may drive short distances (i.e. 30 minutes or less during the day) and increase his frequency and duration as tolerates. He has been contacted by cardiac rehab but does not wish to participate as is able to exercise on his own. He was instructed to continue with sternal precautions (i.e. no lifting more than 10 pounds for the next 4-5 weeks). Regarding his return to work, I instructed him he may do so (as he has a sitting job);however, this will may lead to increased lower extremity edema. He was instructed to keep his legs elevated as much as possible. Also, I recommended him returning for half days to see if he is able to tolerate this. He wants to  return to full days. I instructed him if he develops increased swelling, redness, fever, chills, or drainage to contact the office immediatly. Otherwise, he will see Dr. Servando Snare in around 2 weeks. He has a follow up to see his cardiologist, Dr. Einar Gip, on 01/14/2014.

## 2014-01-05 NOTE — Patient Instructions (Signed)
Coronary Artery Bypass Grafting, Care After °Refer to this sheet in the next few weeks. These instructions provide you with information on caring for yourself after your procedure. Your health care provider may also give you more specific instructions. Your treatment has been planned according to current medical practices, but problems sometimes occur. Call your health care provider if you have any problems or questions after your procedure. °WHAT TO EXPECT AFTER THE PROCEDURE °Recovery from surgery will be different for everyone. Some people feel well after 3 or 4 weeks, while for others it takes longer. After your procedure, it is typical to have the following: °· Nausea and a lack of appetite.   °· Constipation. °· Weakness and fatigue.   °· Depression or irritability.   °· Pain or discomfort at your incision site. °HOME CARE INSTRUCTIONS °· Take medicines only as directed by your health care provider. Do not stop taking medicines or start any new medicines without first checking with your health care provider. °· Take your pulse as directed by your health care provider. °· Perform deep breathing as directed by your health care provider. If you were given a device called an incentive spirometer, use it to practice deep breathing several times a day. Support your chest with a pillow or your arms when you take deep breaths or cough. °· Keep incision areas clean, dry, and protected. Remove or change any bandages (dressings) only as directed by your health care provider. You may have skin adhesive strips over the incision areas. Do not take the strips off. They will fall off on their own. °· Check incision areas daily for any swelling, redness, or drainage. °· If incisions were made in your legs, do the following: °¨ Avoid crossing your legs.   °¨ Avoid sitting for long periods of time. Change positions every 30 minutes.   °¨ Elevate your legs when you are sitting. °· Wear compression stockings as directed by your  health care provider. These stockings help keep blood clots from forming in your legs. °· Take showers once your health care provider approves. Until then, only take sponge baths. Pat incisions dry. Do not rub incisions with a washcloth or towel. Do not take baths, swim, or use a hot tub until your health care provider approves. °· Eat foods that are high in fiber, such as raw fruits and vegetables, whole grains, beans, and nuts. Meats should be lean cut. Avoid canned, processed, and fried foods. °· Drink enough fluid to keep your urine clear or pale yellow. °· Weigh yourself every day. This helps identify if you are retaining fluid that may make your heart and lungs work harder. °· Rest and limit activity as directed by your health care provider. You may be instructed to: °¨ Stop any activity at once if you have chest pain, shortness of breath, irregular heartbeats, or dizziness. Get help right away if you have any of these symptoms. °¨ Move around frequently for short periods or take short walks as directed by your health care provider. Increase your activities gradually. You may need physical therapy or cardiac rehabilitation to help strengthen your muscles and build your endurance. °¨ Avoid lifting, pushing, or pulling anything heavier than 10 lb (4.5 kg) for at least 6 weeks after surgery. °· Do not drive until your health care provider approves.  °· Ask your health care provider when you may return to work. °· Ask your health care provider when you may resume sexual activity. °· Keep all follow-up visits as directed by your health care   provider. This is important. °SEEK MEDICAL CARE IF: °· You have swelling, redness, increasing pain, or drainage at the site of an incision. °· You have a fever. °· You have swelling in your ankles or legs. °· You have pain in your legs.   °· You gain 2 or more pounds (0.9 kg) a day. °· You are nauseous or vomit. °· You have diarrhea.  °SEEK IMMEDIATE MEDICAL CARE IF: °· You have  chest pain that goes to your jaw or arms. °· You have shortness of breath.   °· You have a fast or irregular heartbeat.   °· You notice a "clicking" in your breastbone (sternum) when you move.   °· You have numbness or weakness in your arms or legs. °· You feel dizzy or light-headed.   °MAKE SURE YOU: °· Understand these instructions. °· Will watch your condition. °· Will get help right away if you are not doing well or get worse. °Document Released: 11/11/2004 Document Revised: 09/08/2013 Document Reviewed: 10/01/2012 °ExitCare® Patient Information ©2015 [employer]. This information is not intended to replace advice given to you by your health care provider. Make sure you discuss any questions you have with your health care provider. ° °

## 2014-01-15 ENCOUNTER — Encounter: Payer: 59 | Admitting: Cardiothoracic Surgery

## 2014-01-16 ENCOUNTER — Other Ambulatory Visit: Payer: Self-pay | Admitting: Cardiothoracic Surgery

## 2014-01-16 DIAGNOSIS — I2510 Atherosclerotic heart disease of native coronary artery without angina pectoris: Secondary | ICD-10-CM

## 2014-01-19 ENCOUNTER — Ambulatory Visit (INDEPENDENT_AMBULATORY_CARE_PROVIDER_SITE_OTHER): Payer: Self-pay | Admitting: Surgical

## 2014-01-19 ENCOUNTER — Ambulatory Visit
Admission: RE | Admit: 2014-01-19 | Discharge: 2014-01-19 | Disposition: A | Discharge status: Home / Self Care | Payer: 59 | Source: Ambulatory Visit | Attending: Cardiothoracic Surgery | Admitting: Cardiothoracic Surgery

## 2014-01-19 VITALS — BP 137/80 | HR 61 | Resp 20 | Ht 66.0 in | Wt 232.0 lb

## 2014-01-19 DIAGNOSIS — I2510 Atherosclerotic heart disease of native coronary artery without angina pectoris: Secondary | ICD-10-CM

## 2014-01-19 DIAGNOSIS — I209 Angina pectoris, unspecified: Secondary | ICD-10-CM

## 2014-01-19 DIAGNOSIS — I25119 Atherosclerotic heart disease of native coronary artery with unspecified angina pectoris: Secondary | ICD-10-CM

## 2014-01-19 DIAGNOSIS — Z951 Presence of aortocoronary bypass graft: Secondary | ICD-10-CM

## 2014-01-19 NOTE — Patient Instructions (Signed)
Reviewed progression instructions with the patient

## 2014-01-19 NOTE — Progress Notes (Signed)
[address]       [place] [address]             [phone]                  Turrell Wiest Gilboa Medical Record #[id number] Date of Birth: [birth year]  Referring SW:NIOEV, Turner Daniels, MD Primary Cardiology: Primary Care:PHARR,WALTER DAVIDSON, MD  Chief Complaint:  Follow Up Visit   History of Present Illness:    A 63 year old male status post CABG x5 on 12/19/2013 by Dr. Servando Snare. He is seen in routine office followup. He is continuing to make excellent progress and in fact has returned to work full-time without difficulty. 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 He has been seen in followup with cardiology and is scheduled to discontinue amiodarone at the end of next week.         Zubrod Score: At the time of surgery this patient's most appropriate activity status/level should be described as: []     0    Normal activity, no symptoms []     1    Restricted in physical strenuous activity but ambulatory, able to do out light work []     2    Ambulatory and capable of self care, unable to do work activities, up and about                 >50 % of waking hours                                                                                   []     3    Only limited self care, in bed greater than 50% of waking hours []     4    Completely disabled, no self care, confined to bed or chair []     5    Moribund  History  Smoking status  . Never Smoker   Smokeless tobacco  . Never Used       No Known Allergies  Current Outpatient Prescriptions  Medication Sig Dispense Refill  . amiodarone (PACERONE) 400 MG tablet Take 200 mg by mouth daily.      Marland Kitchen aspirin EC 81 MG tablet Take 81 mg by mouth daily.      Marland Kitchen atorvastatin (LIPITOR) 80 MG tablet Take 80 mg by mouth daily.      . clopidogrel (PLAVIX) 75 MG tablet Take 1 tablet (75 mg total) by mouth daily with breakfast.  30 tablet  1  . lisinopril (PRINIVIL,ZESTRIL) 5 MG  tablet Take 1 tablet (5 mg total) by mouth daily.  30 tablet  1  . metoprolol tartrate (LOPRESSOR) 12.5 mg TABS tablet Take 1.5 tablets (37.5 mg total) by mouth 2 (two) times daily.  90 each  1   No current facility-administered medications for this visit.       Physical Exam: BP 137/80  Pulse 61  Resp 20  Ht 5\' 6"  (1.676 m)  Wt 232 lb (105.235 kg)  BMI 37.46 kg/m2  SpO2 95%  General appearance: alert, cooperative and no distress Heart: regular rate and rhythm Lungs:  clear to auscultation bilaterally Abdomen: soft, non-tender; bowel sounds normal; no masses,  no organomegaly Extremities: No edema Wound: Incisions healing well without evidence of infection   Diagnostic Studies & Laboratory data:         Recent Radiology Findings: Dg Chest 2 View  01/19/2014   CLINICAL DATA:  Coronary artery disease and sleep apnea  EXAM: CHEST  2 VIEW  COMPARISON:  January 05, 2014  FINDINGS: The lungs are clear. The heart size and pulmonary vascularity are normal. No adenopathy. Patient is status post coronary artery bypass grafting. There is degenerative change in the thoracic spine.  IMPRESSION: No edema or consolidation.   Electronically Signed   By: Lowella Grip M.D.   On: 01/19/2014 13:44      Recent Labs: Lab Results  Component Value Date   WBC 7.4 12/25/2013   HGB 11.7* 12/25/2013   HCT 35.2* 12/25/2013   PLT 202 12/25/2013   GLUCOSE 121* 12/25/2013   ALT 31 12/17/2013   AST 25 12/17/2013   NA 139 12/25/2013   K 3.7 12/25/2013   CL 102 12/25/2013   CREATININE 0.83 12/25/2013   BUN 13 12/25/2013   CO2 24 12/25/2013   TSH 8.590* 12/25/2013   INR 1.46 12/19/2013   HGBA1C 5.4 12/17/2013      Assessment / Plan:  Overall the patient is doing quite well. We will see again in 3 months.          Deondria Puryear E 01/19/2014 1:58 PM

## 2014-01-22 ENCOUNTER — Encounter: Payer: 59 | Admitting: Cardiothoracic Surgery

## 2014-02-17 ENCOUNTER — Other Ambulatory Visit: Payer: Self-pay | Admitting: Surgical

## 2014-03-07 ENCOUNTER — Other Ambulatory Visit: Payer: Self-pay | Admitting: Surgical

## 2014-04-16 ENCOUNTER — Encounter (HOSPITAL_COMMUNITY): Payer: Self-pay | Admitting: Cardiology

## 2014-04-23 ENCOUNTER — Ambulatory Visit (INDEPENDENT_AMBULATORY_CARE_PROVIDER_SITE_OTHER): Payer: 59 | Admitting: Cardiothoracic Surgery

## 2014-04-23 ENCOUNTER — Encounter: Payer: Self-pay | Admitting: Cardiothoracic Surgery

## 2014-04-23 VITALS — BP 124/75 | HR 62 | Resp 20 | Ht 66.0 in | Wt 220.0 lb

## 2014-04-23 DIAGNOSIS — I25119 Atherosclerotic heart disease of native coronary artery with unspecified angina pectoris: Secondary | ICD-10-CM

## 2014-04-23 DIAGNOSIS — Z951 Presence of aortocoronary bypass graft: Secondary | ICD-10-CM

## 2014-04-23 NOTE — Progress Notes (Signed)
[address]       [place] [address]             [phone]      Nilesh Kallstrom Door Medical Record #[id number] Date of Birth: 04-23-1951  Referring: Laverda Page, MD Primary Care: Horatio Pel, MD  Chief Complaint:   POST OP FOLLOW UP Past Surgical History  . Coronary artery bypass graft N/A 12/19/2013    Procedure: CORONARY ARTERY BYPASS GRAFTING (CABG) x5 using right and left greater saphenous thigh vein and left internal mammary artery LIMA-LAD; SEQ SVG-OM1-OM2; SVG-PD; SVG-DIAG;  Surgeon: [name], MD;  Location: [facility];  Service: Open Heart Surgery;  Laterality: N/A;  . Intraoperative transesophageal echocardiogram N/A 12/19/2013    Procedure: INTRAOPERATIVE TRANSESOPHAGEAL ECHOCARDIOGRAM;  Surgeon: [name], MD;  Location: [facility];  Service: Open Heart Surgery;  Laterality: N/A;  . Left heart catheterization with coronary angiogram N/A 12/16/2013    Procedure: LEFT HEART CATHETERIZATION WITH CORONARY ANGIOGRAM;  Surgeon: Laverda Page, MD;  Location: [facility] CATH LAB;  Service: Cardiovascular;  Laterality: N/A;   History of Present Illness:     Patient doing well postoperatively, without evidence of congestive heart failure or recurrent angina. He notes these exercises on a daily basis, mostly walking.     Past Medical History  Diagnosis Date  . Coronary artery disease   . Anginal pain   . Sleep apnea   . Cancer     HX OF PROSTATE CA     History  Smoking status  . Never Smoker   Smokeless tobacco  . Never Used    History  Alcohol Use No     No Known Allergies  Current Outpatient Prescriptions  Medication Sig Dispense Refill  . aspirin EC 81 MG tablet Take 81 mg by mouth daily.    Marland Kitchen atorvastatin (LIPITOR) 80 MG tablet Take 80 mg by mouth daily.    Marland Kitchen buPROPion (WELLBUTRIN XL) 300 MG 24 hr tablet Take 300 mg by mouth daily.     . clopidogrel (PLAVIX) 75 MG tablet Take 1 tablet (75 mg total) by mouth  daily with breakfast. 30 tablet 1  . lisinopril (PRINIVIL,ZESTRIL) 5 MG tablet Take 1 tablet (5 mg total) by mouth daily. 30 tablet 1  . metoprolol tartrate (LOPRESSOR) 25 MG tablet Take 25 mg by mouth 2 (two) times daily.     No current facility-administered medications for this visit.       Physical Exam: BP 124/75 mmHg  Pulse 62  Resp 20  Ht 5\' 6"  (1.676 m)  Wt 220 lb (99.791 kg)  BMI 35.53 kg/m2  SpO2 98%  General appearance: alert and cooperative Neurologic: intact Heart: regular rate and rhythm, S1, S2 normal, no murmur, click, rub or gallop Lungs: clear to auscultation bilaterally Abdomen: soft, non-tender; bowel sounds normal; no masses,  no organomegaly Extremities: extremities normal, atraumatic, no cyanosis or edema and Homans sign is negative, no sign of DVT Wound: Patient sternum is stable and well-healed   Diagnostic Studies & Laboratory data:     Recent Radiology Findings:   No results found.    Recent Lab Findings: Lab Results  Component Value Date   WBC 7.4 12/25/2013   HGB 11.7* 12/25/2013   HCT 35.2* 12/25/2013   PLT 202 12/25/2013   GLUCOSE 121* 12/25/2013   ALT 31 12/17/2013   AST 25 12/17/2013   NA 139 12/25/2013   K 3.7 12/25/2013  CL 102 12/25/2013   CREATININE 0.83 12/25/2013   BUN 13 12/25/2013   CO2 24 12/25/2013   TSH 8.590* 12/25/2013   INR 1.46 12/19/2013   HGBA1C 5.4 12/17/2013      Assessment / Plan:     Patient stable after  coronary artery bypass grafting, he has just seen his cardiologist to review medications. I'll plan to see him back as needed.      [name] MD      [address] [place] [address] Office [phone]   Beeper [phone]  04/23/2014 12:20 PM

## 2015-10-10 IMAGING — CR DG CHEST 2V
2 series · 2 of 2 positions shown · non-contrast
Comparison: None.

CLINICAL DATA: Pre operative respiratory exam. Coronary artery
disease.

EXAM:
CHEST  2 VIEW

[w chest pa]
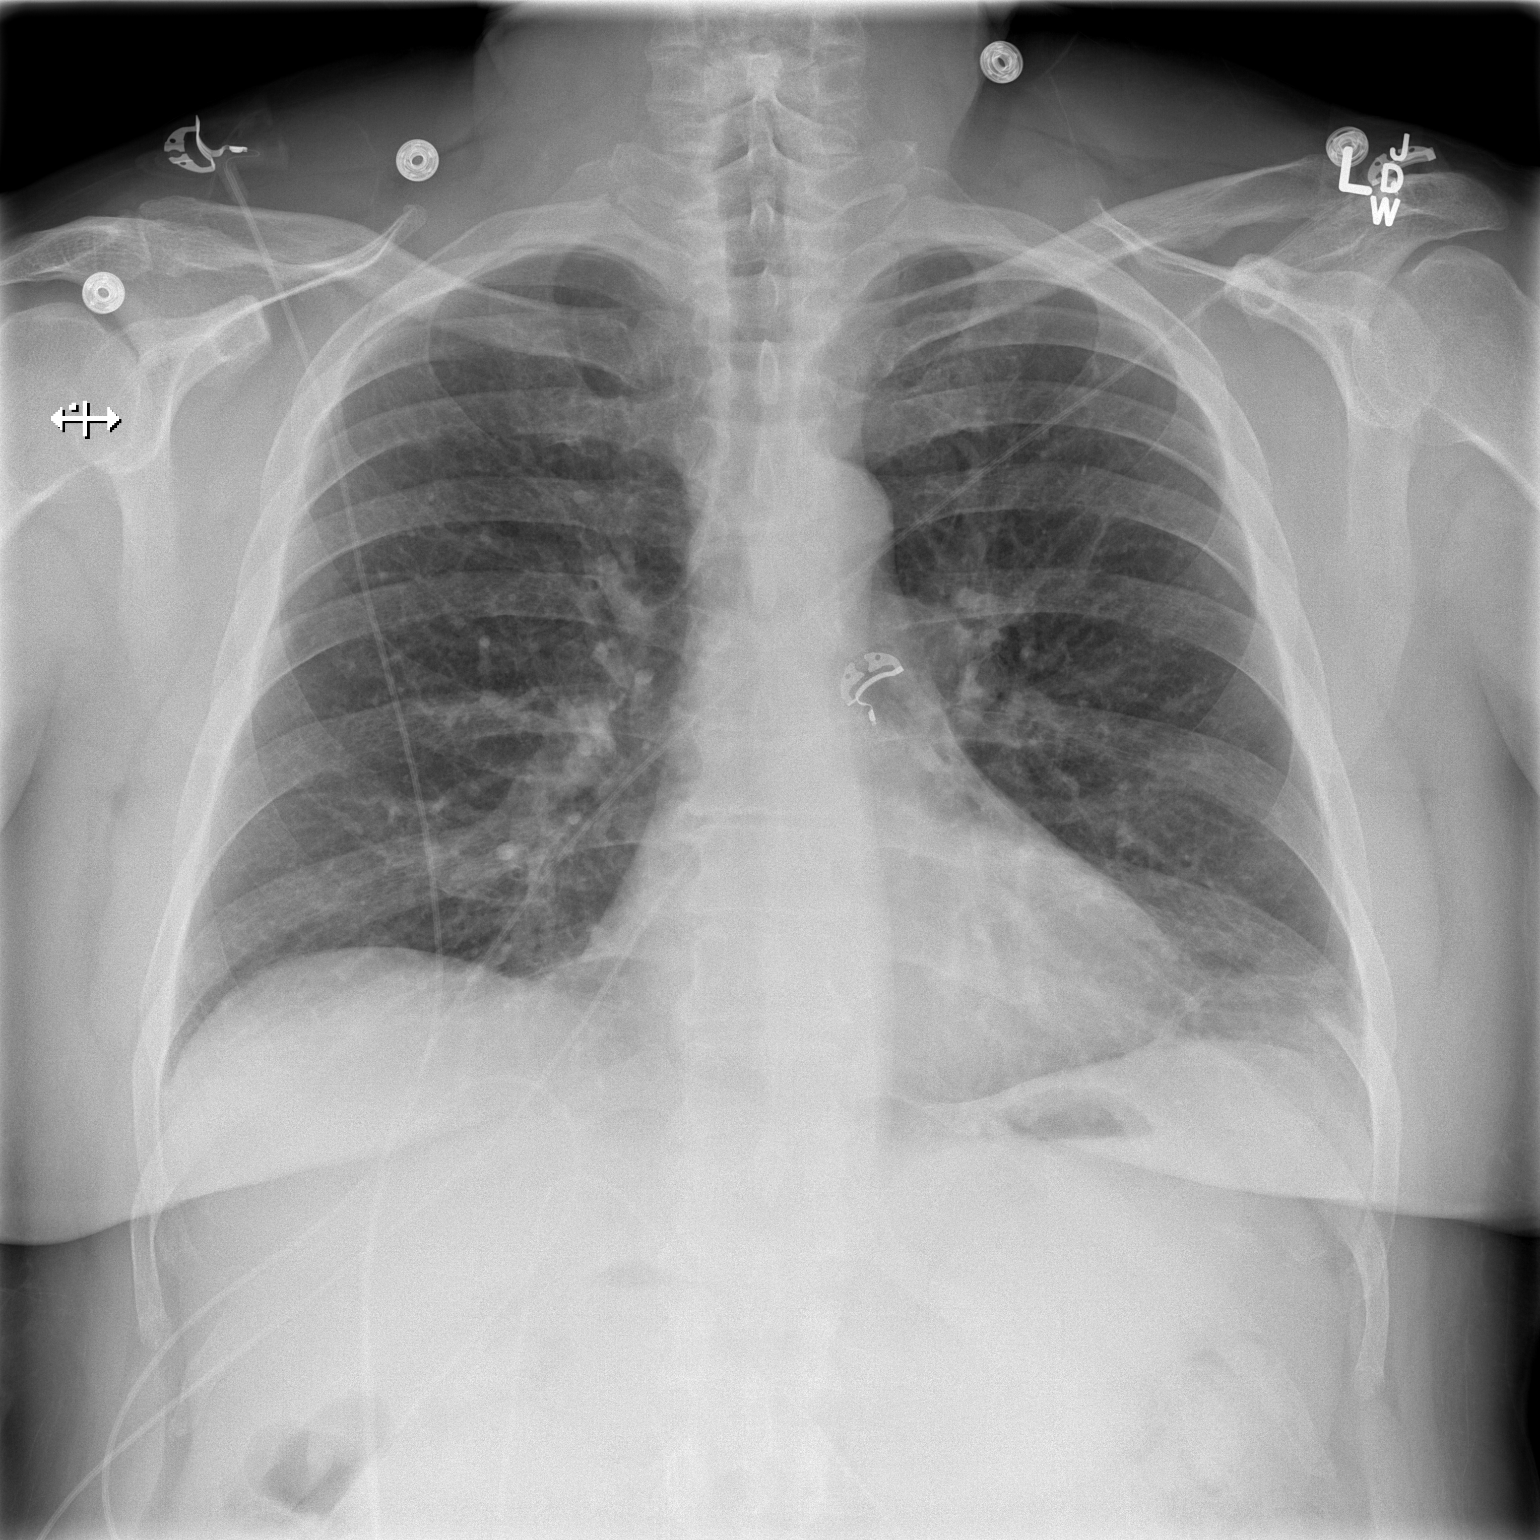

[w chest lat]
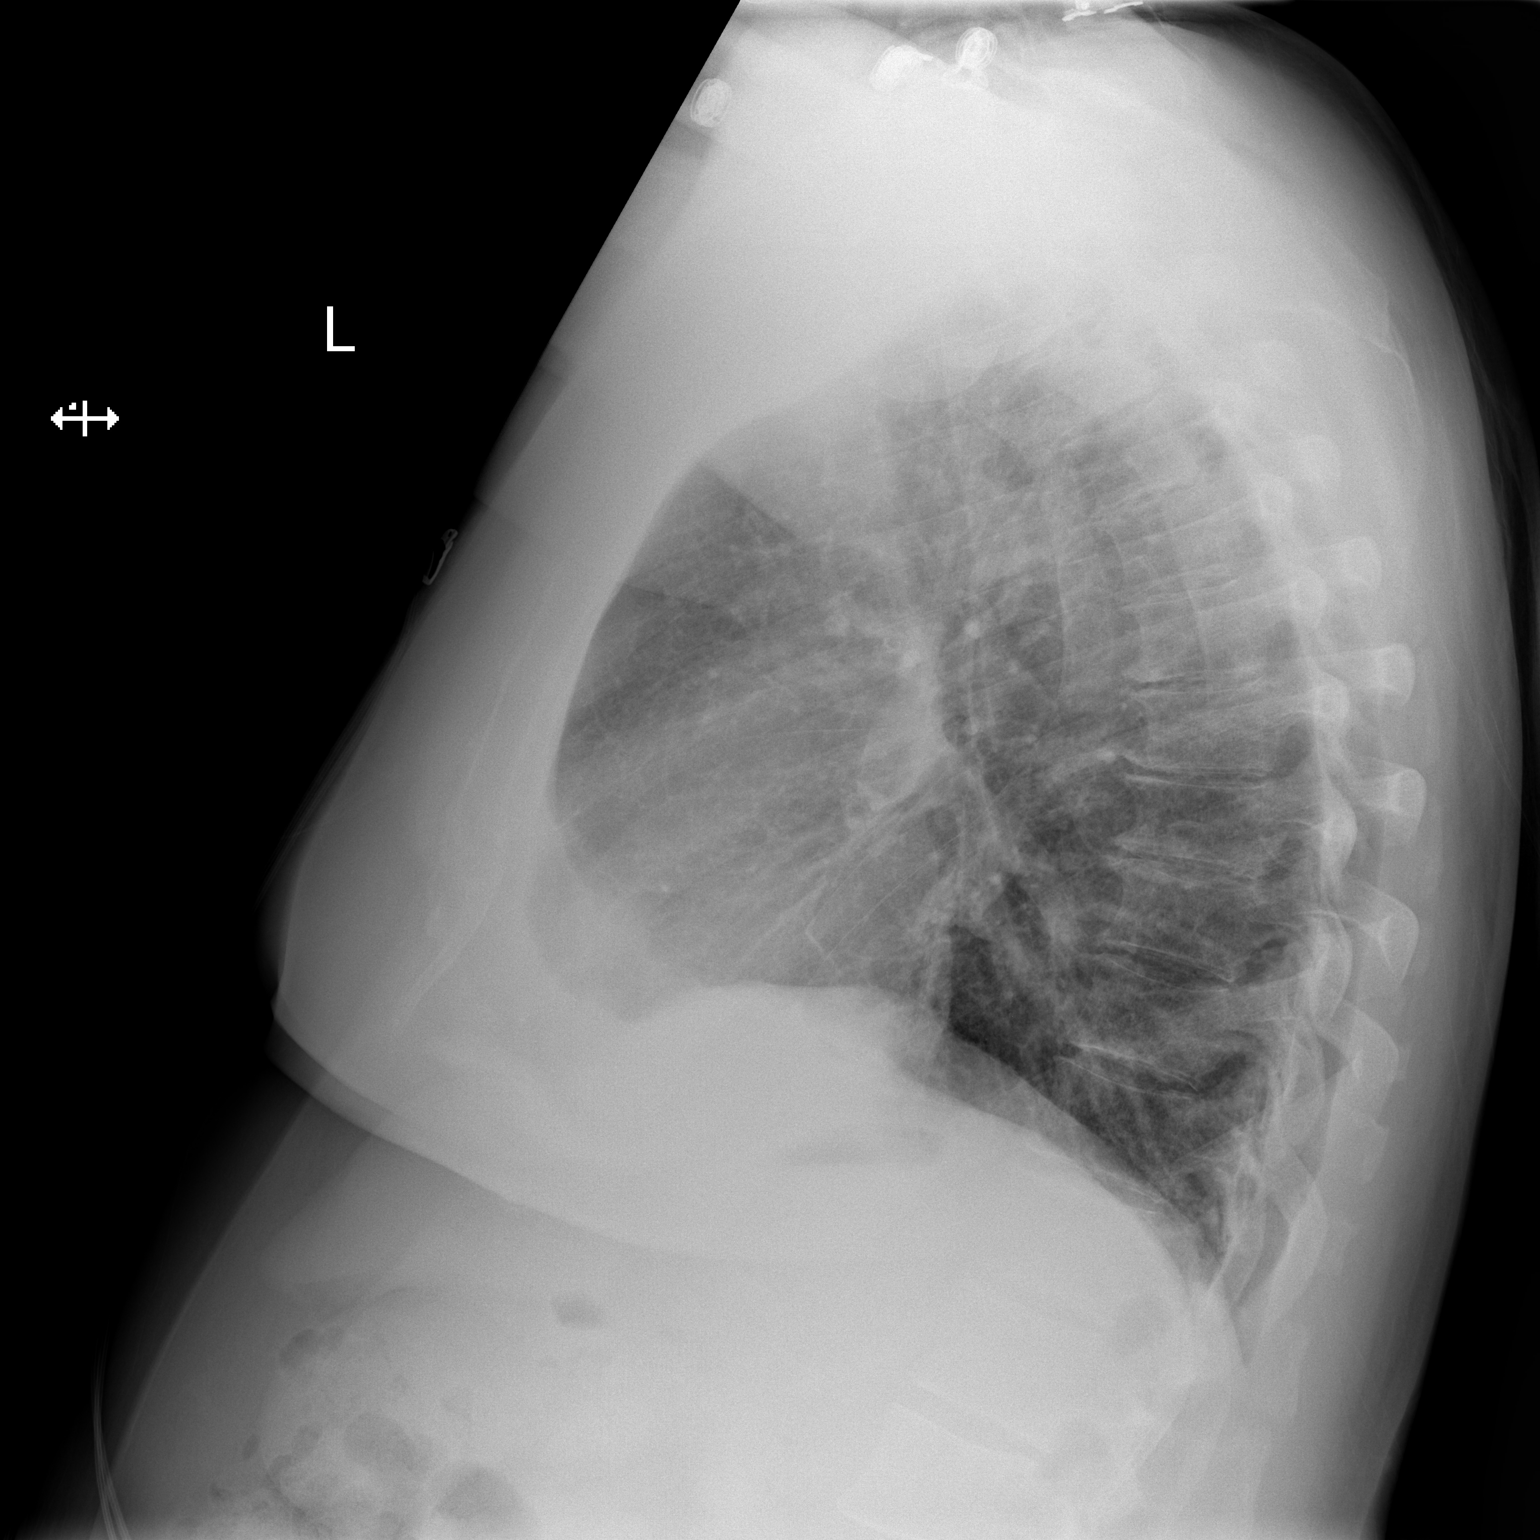

[2 of 2 positions shown; findings below may reference images not displayed]

FINDINGS: Heart size and pulmonary vascularity are normal and the lungs are
clear except for slight scarring or possibly a prominent left
pericardial fat pad at the left base laterally. No significant
osseous abnormality.
IMPRESSION: No significant abnormalities.

## 2015-10-14 IMAGING — CR DG CHEST 1V PORT
1 series · 1 of 1 positions shown · non-contrast
Comparison: Yesterday

CLINICAL DATA: Postop from cardiac surgery

EXAM:
PORTABLE CHEST - 1 VIEW

[AP]
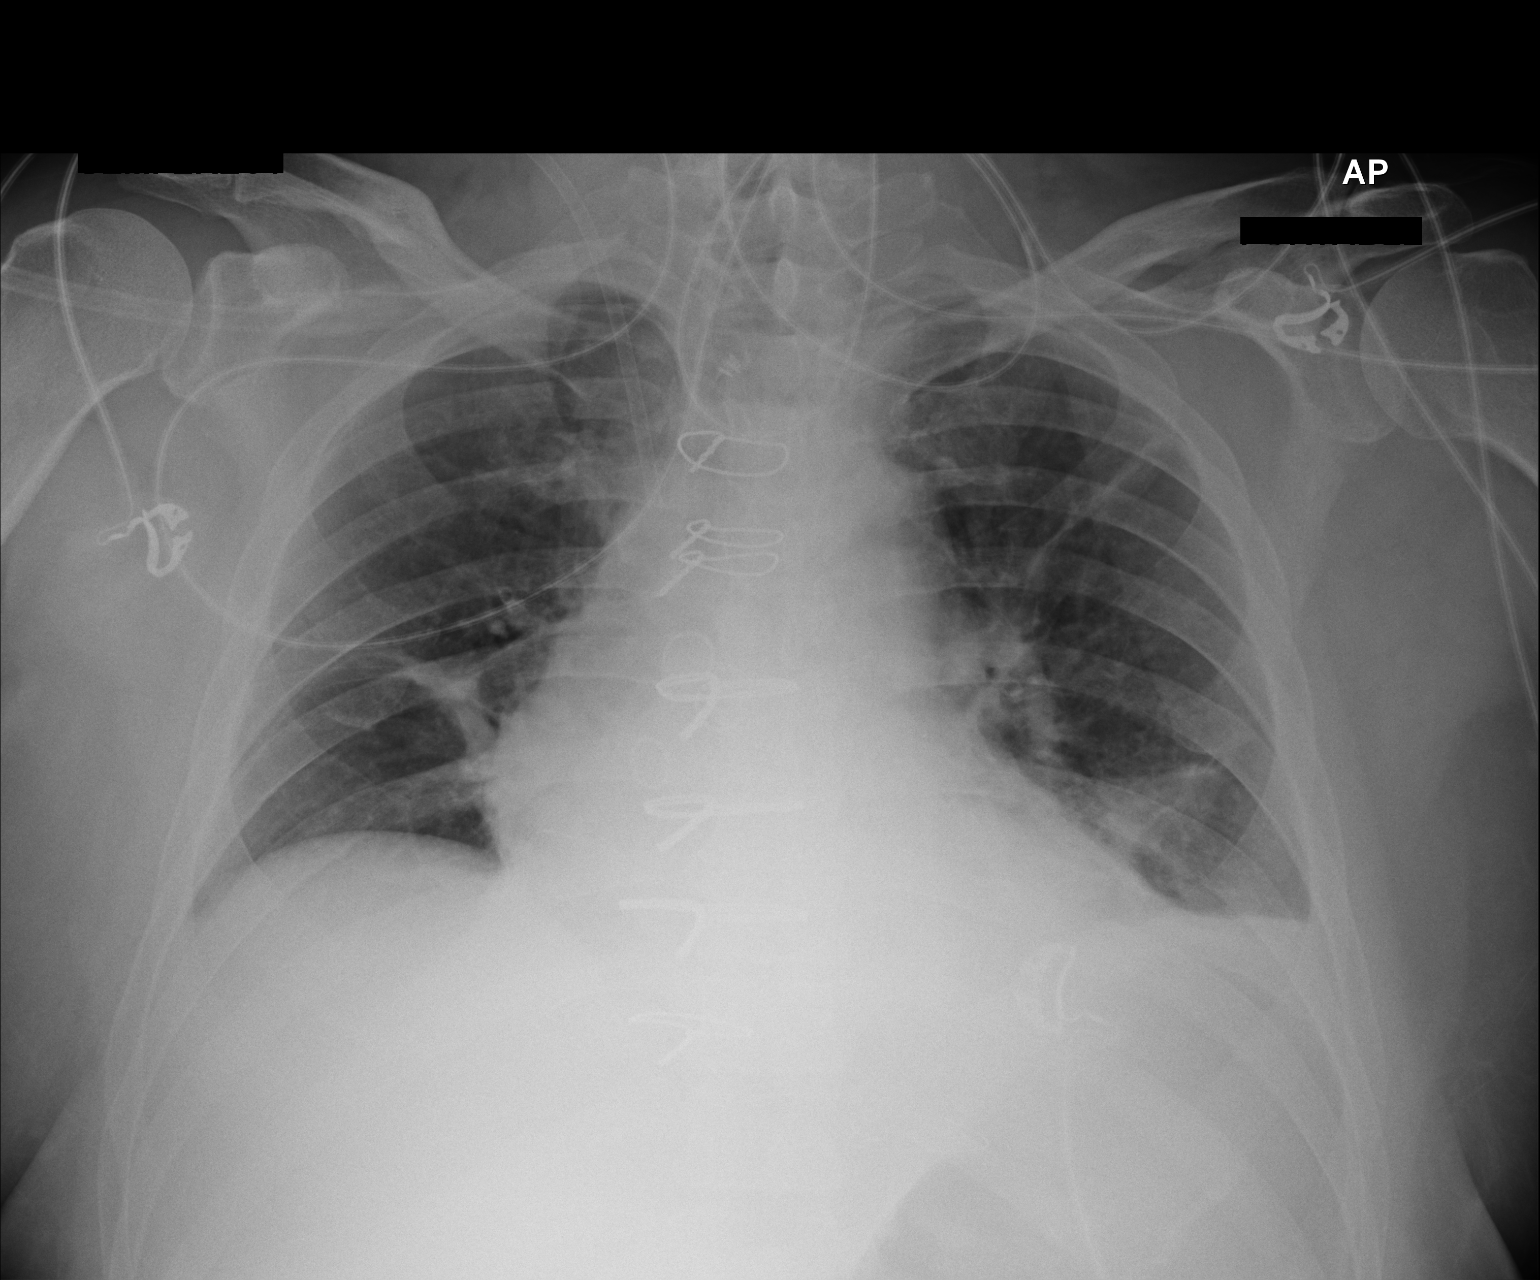

[1 of 1 positions shown; findings below may reference images not displayed]

FINDINGS: Boodram removed with the introducer left in place. Low lung
volumes and bilateral scattered atelectasis persist with some
improvement at the left base. Bilateral small pleural effusions
persist. Left chest tube has been removed without ensuing
pneumothorax. No sign of pulmonary edema. Normal heart size and
normal-appearing mediastinum.
IMPRESSION: Chest tube removed without pneumothorax.

No pneumothorax or CHF

Scattered atelectasis with some improvement at the left base.

## 2015-10-15 IMAGING — CR DG CHEST 1V PORT
1 series · 1 of 1 positions shown · non-contrast
Comparison: December 21, 2013

CLINICAL DATA: Coronary artery disease

EXAM:
PORTABLE CHEST - 1 VIEW

[portable]
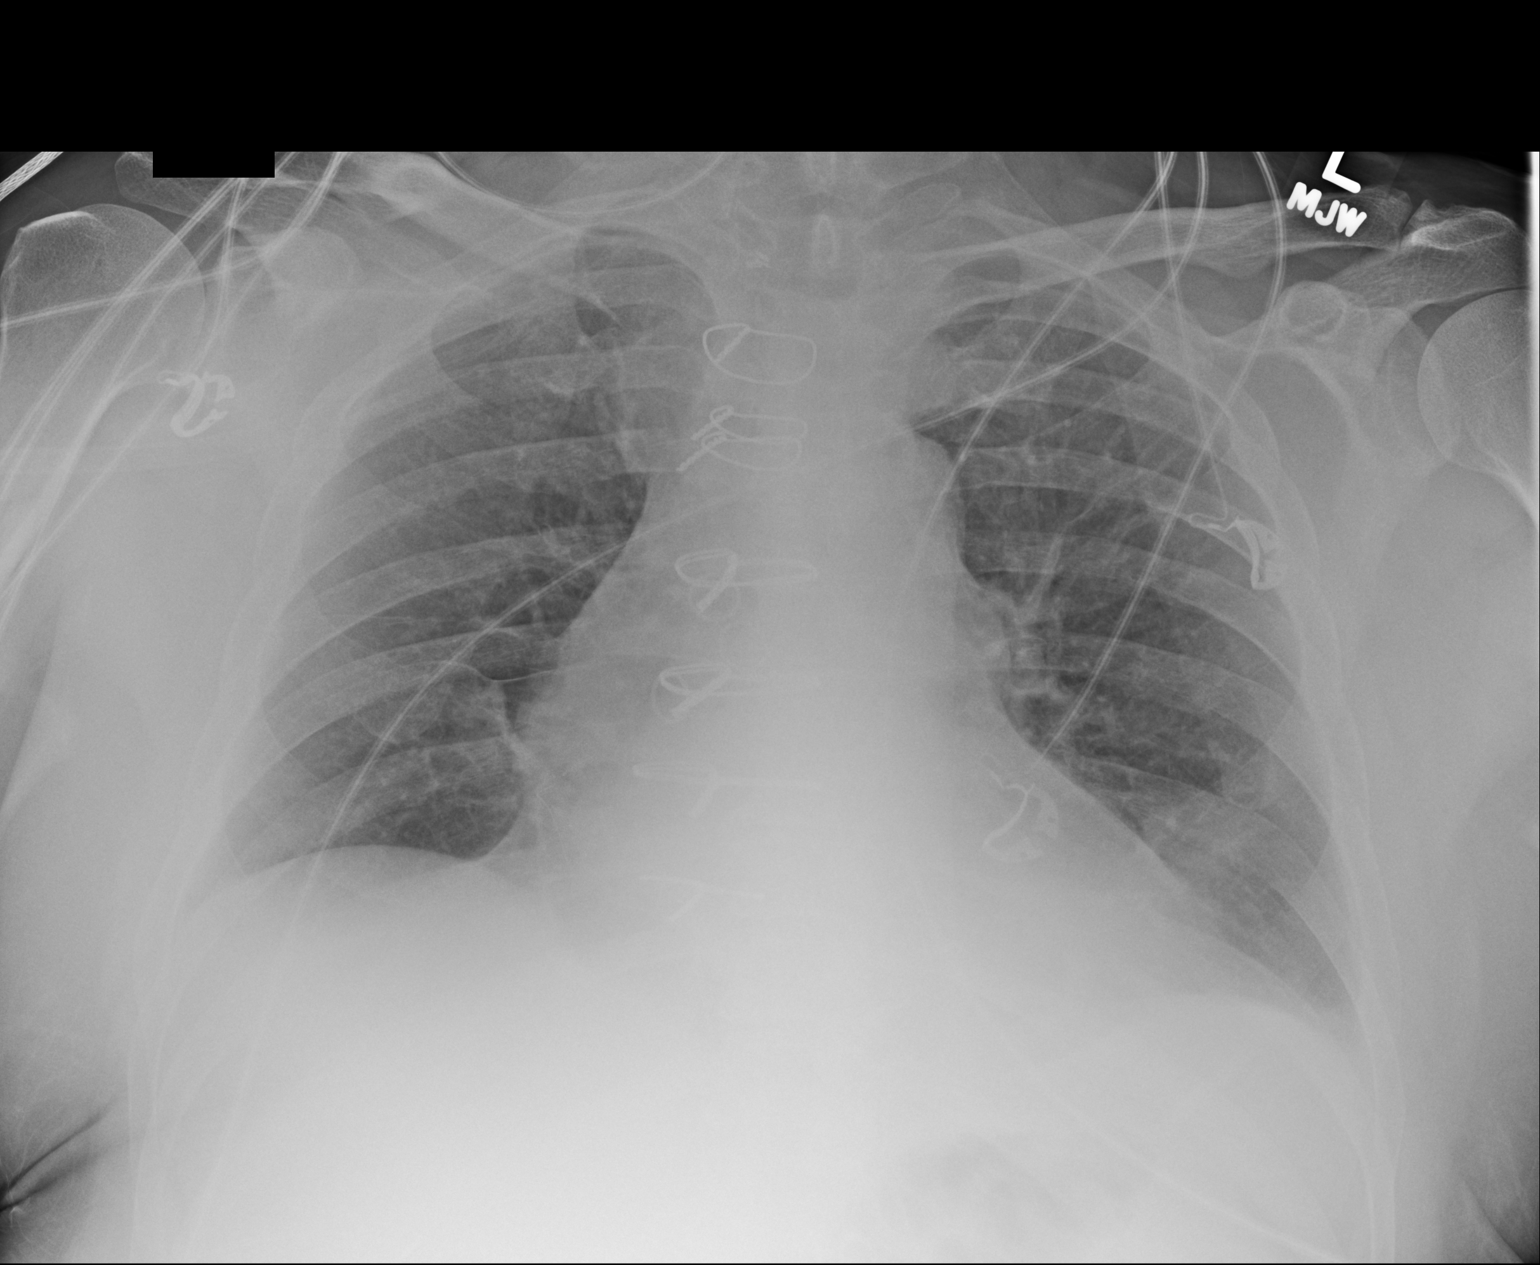

[1 of 1 positions shown; findings below may reference images not displayed]

FINDINGS: There has been partial clearing of bibasilar atelectatic change
compared to 1 day prior. Mild patchy bibasilar atelectasis remains.
No new opacity. Heart is enlarged with pulmonary vascularity.
Patient is status post coronary artery bypass grafting. No
pneumothorax. No adenopathy. Cordis is seen in the right neck region
with the tip in the jugular vein region on the right.
IMPRESSION: Partial but incomplete clearing of bibasilar atelectatic change. No
new opacity. Stable cardiac enlargement. Cordis tip in right jugular
vein region. No pneumothorax.

## 2018-06-06 ENCOUNTER — Other Ambulatory Visit: Payer: Self-pay | Admitting: Internal Medicine

## 2018-06-06 DIAGNOSIS — R748 Abnormal levels of other serum enzymes: Secondary | ICD-10-CM

## 2018-06-10 ENCOUNTER — Ambulatory Visit
Admission: RE | Admit: 2018-06-10 | Discharge: 2018-06-10 | Disposition: A | Discharge status: Home / Self Care | Payer: Managed Care, Other (non HMO) | Source: Ambulatory Visit | Attending: Internal Medicine | Admitting: Internal Medicine

## 2018-06-10 DIAGNOSIS — R748 Abnormal levels of other serum enzymes: Secondary | ICD-10-CM

## 2018-10-24 ENCOUNTER — Other Ambulatory Visit: Payer: Self-pay

## 2018-10-24 MED ORDER — METOPROLOL TARTRATE 25 MG PO TABS: 25.0000 mg | ORAL_TABLET | Freq: Two times a day (BID) | ORAL | 0 refills | Status: DC

## 2018-10-25 ENCOUNTER — Other Ambulatory Visit: Payer: Self-pay

## 2018-11-04 ENCOUNTER — Other Ambulatory Visit: Payer: Self-pay | Admitting: Cardiology

## 2018-11-04 ENCOUNTER — Telehealth: Payer: Self-pay

## 2018-11-04 ENCOUNTER — Other Ambulatory Visit: Payer: Self-pay

## 2018-11-04 DIAGNOSIS — I2510 Atherosclerotic heart disease of native coronary artery without angina pectoris: Secondary | ICD-10-CM

## 2018-11-04 DIAGNOSIS — I10 Essential (primary) hypertension: Secondary | ICD-10-CM

## 2018-11-04 MED ORDER — METOPROLOL SUCCINATE ER 25 MG PO TB24: 25.0000 mg | ORAL_TABLET | Freq: Every day | ORAL | 3 refills | Status: AC

## 2018-11-04 NOTE — Progress Notes (Signed)
done

## 2018-11-04 NOTE — Progress Notes (Signed)
Metoprolol succinate added to medication list. Patient was changed from tartrate to succinate in 2017 as noted in [employer].

## 2018-11-04 NOTE — Telephone Encounter (Signed)
Pt states that he is on a metoprolol succ 25mg  er daily; I cannot seem to find this; Can you help me?

## 2018-12-24 ENCOUNTER — Other Ambulatory Visit: Payer: Self-pay

## 2018-12-24 DIAGNOSIS — I2510 Atherosclerotic heart disease of native coronary artery without angina pectoris: Secondary | ICD-10-CM

## 2018-12-24 MED ORDER — LISINOPRIL 5 MG PO TABS: 5.0000 mg | ORAL_TABLET | Freq: Every day | ORAL | 1 refills | Status: DC

## 2018-12-25 ENCOUNTER — Other Ambulatory Visit: Payer: Self-pay

## 2018-12-25 DIAGNOSIS — I2510 Atherosclerotic heart disease of native coronary artery without angina pectoris: Secondary | ICD-10-CM

## 2018-12-25 MED ORDER — LISINOPRIL 5 MG PO TABS: 5.0000 mg | ORAL_TABLET | Freq: Every day | ORAL | 1 refills | Status: AC

## 2019-02-17 ENCOUNTER — Ambulatory Visit: Payer: Self-pay | Admitting: Cardiology

## 2019-05-16 ENCOUNTER — Other Ambulatory Visit: Payer: Self-pay | Admitting: Cardiology
